# Patient Record
Sex: Male | Born: 1940
Health system: Southern US, Community
[De-identification: ages and names within clinical notes are randomized; demographics above are authoritative.]

## PROBLEM LIST (undated history)

## (undated) DIAGNOSIS — I2510 Atherosclerotic heart disease of native coronary artery without angina pectoris: Secondary | ICD-10-CM

## (undated) DIAGNOSIS — I10 Essential (primary) hypertension: Secondary | ICD-10-CM

## (undated) DIAGNOSIS — I4891 Unspecified atrial fibrillation: Secondary | ICD-10-CM

## (undated) HISTORY — DX: Essential (primary) hypertension: I10

## (undated) HISTORY — PX: INGUINAL HERNIA REPAIR: SUR1180

## (undated) HISTORY — PX: FOOT SURGERY: SHX648

## (undated) HISTORY — DX: Unspecified atrial fibrillation: I48.91

## (undated) HISTORY — PX: CARDIAC CATHETERIZATION: SHX172

## (undated) HISTORY — PX: BACK SURGERY: SHX140

## (undated) HISTORY — PX: CORONARY ANGIOPLASTY: SHX604

## (undated) HISTORY — PX: APPENDECTOMY: SHX54

## (undated) HISTORY — PX: TONSILLECTOMY AND ADENOIDECTOMY: SUR1326

## (undated) HISTORY — DX: Atherosclerotic heart disease of native coronary artery without angina pectoris: I25.10

---

## 1998-11-27 ENCOUNTER — Ambulatory Visit (HOSPITAL_COMMUNITY): Admission: RE | Admit: 1998-11-27 | Discharge: 1998-11-27 | Payer: Self-pay | Admitting: Cardiology

## 1998-12-03 ENCOUNTER — Ambulatory Visit (HOSPITAL_COMMUNITY): Admission: RE | Admit: 1998-12-03 | Discharge: 1998-12-03 | Payer: Self-pay | Admitting: Cardiovascular Disease

## 2003-12-05 ENCOUNTER — Ambulatory Visit: Payer: Self-pay | Admitting: Cardiology

## 2003-12-19 ENCOUNTER — Ambulatory Visit: Payer: Self-pay | Admitting: Cardiology

## 2004-01-09 ENCOUNTER — Ambulatory Visit: Payer: Self-pay | Admitting: Internal Medicine

## 2004-02-27 ENCOUNTER — Ambulatory Visit: Payer: Self-pay | Admitting: Cardiology

## 2004-03-26 ENCOUNTER — Ambulatory Visit: Payer: Self-pay | Admitting: Internal Medicine

## 2004-04-23 ENCOUNTER — Ambulatory Visit: Payer: Self-pay | Admitting: Cardiology

## 2004-05-21 ENCOUNTER — Ambulatory Visit: Payer: Self-pay | Admitting: Cardiology

## 2004-06-18 ENCOUNTER — Ambulatory Visit: Payer: Self-pay | Admitting: Cardiology

## 2004-07-17 ENCOUNTER — Ambulatory Visit: Payer: Self-pay | Admitting: Cardiology

## 2004-08-13 ENCOUNTER — Ambulatory Visit: Payer: Self-pay

## 2004-09-10 ENCOUNTER — Ambulatory Visit: Payer: Self-pay | Admitting: Cardiology

## 2004-09-24 ENCOUNTER — Ambulatory Visit: Payer: Self-pay | Admitting: Cardiology

## 2004-10-23 ENCOUNTER — Ambulatory Visit: Payer: Self-pay | Admitting: Cardiology

## 2004-11-19 ENCOUNTER — Ambulatory Visit: Payer: Self-pay | Admitting: Cardiology

## 2004-12-17 ENCOUNTER — Ambulatory Visit: Payer: Self-pay | Admitting: Cardiovascular Disease

## 2005-01-14 ENCOUNTER — Ambulatory Visit: Payer: Self-pay | Admitting: Cardiology

## 2005-12-30 ENCOUNTER — Encounter: Admission: RE | Admit: 2005-12-30 | Discharge: 2005-12-30 | Payer: Self-pay | Admitting: Orthopedic Surgery

## 2008-05-26 ENCOUNTER — Encounter (INDEPENDENT_AMBULATORY_CARE_PROVIDER_SITE_OTHER): Payer: Self-pay | Admitting: *Deleted

## 2008-06-13 ENCOUNTER — Encounter: Payer: Self-pay | Admitting: *Deleted

## 2008-07-03 ENCOUNTER — Encounter (INDEPENDENT_AMBULATORY_CARE_PROVIDER_SITE_OTHER): Payer: Self-pay | Admitting: *Deleted

## 2008-07-04 ENCOUNTER — Encounter: Payer: Self-pay | Admitting: Cardiology

## 2008-07-04 LAB — CONVERTED CEMR LAB
POC INR: 2.3
Prothrombin Time: 22.7 s

## 2008-07-19 ENCOUNTER — Encounter: Payer: Self-pay | Admitting: *Deleted

## 2008-08-18 ENCOUNTER — Encounter (INDEPENDENT_AMBULATORY_CARE_PROVIDER_SITE_OTHER): Payer: Self-pay | Admitting: Cardiology

## 2008-08-21 ENCOUNTER — Encounter: Payer: Self-pay | Admitting: Cardiology

## 2008-09-14 ENCOUNTER — Encounter (INDEPENDENT_AMBULATORY_CARE_PROVIDER_SITE_OTHER): Payer: Self-pay | Admitting: Cardiology

## 2008-09-15 ENCOUNTER — Encounter: Payer: Self-pay | Admitting: Internal Medicine

## 2008-09-15 LAB — CONVERTED CEMR LAB: POC INR: 2.4

## 2008-10-24 ENCOUNTER — Encounter (INDEPENDENT_AMBULATORY_CARE_PROVIDER_SITE_OTHER): Payer: Self-pay | Admitting: Cardiology

## 2008-10-26 ENCOUNTER — Encounter: Payer: Self-pay | Admitting: Cardiology

## 2008-10-26 LAB — CONVERTED CEMR LAB
POC INR: 3
Prothrombin Time: 32 s

## 2008-11-21 ENCOUNTER — Encounter (INDEPENDENT_AMBULATORY_CARE_PROVIDER_SITE_OTHER): Payer: Self-pay | Admitting: Pharmacist

## 2008-11-22 ENCOUNTER — Encounter: Payer: Self-pay | Admitting: Internal Medicine

## 2008-11-22 LAB — CONVERTED CEMR LAB
POC INR: 2
Prothrombin Time: 21.6 s

## 2008-12-18 ENCOUNTER — Encounter (INDEPENDENT_AMBULATORY_CARE_PROVIDER_SITE_OTHER): Payer: Self-pay | Admitting: Cardiology

## 2008-12-20 ENCOUNTER — Encounter: Payer: Self-pay | Admitting: Cardiology

## 2008-12-20 LAB — CONVERTED CEMR LAB
POC INR: 2.6
Prothrombin Time: 27.3 s

## 2009-01-17 ENCOUNTER — Encounter (INDEPENDENT_AMBULATORY_CARE_PROVIDER_SITE_OTHER): Payer: Self-pay | Admitting: Cardiology

## 2009-01-19 ENCOUNTER — Encounter: Payer: Self-pay | Admitting: Cardiology

## 2009-01-19 LAB — CONVERTED CEMR LAB
POC INR: 2.3
Prothrombin Time: 23.9 s

## 2009-02-16 ENCOUNTER — Encounter (INDEPENDENT_AMBULATORY_CARE_PROVIDER_SITE_OTHER): Payer: Self-pay | Admitting: Cardiology

## 2009-02-19 ENCOUNTER — Telehealth (INDEPENDENT_AMBULATORY_CARE_PROVIDER_SITE_OTHER): Payer: Self-pay | Admitting: Cardiology

## 2009-02-26 ENCOUNTER — Encounter: Payer: Self-pay | Admitting: Cardiology

## 2009-03-21 ENCOUNTER — Encounter (INDEPENDENT_AMBULATORY_CARE_PROVIDER_SITE_OTHER): Payer: Self-pay | Admitting: Cardiology

## 2009-03-22 ENCOUNTER — Encounter: Payer: Self-pay | Admitting: Cardiology

## 2009-03-22 LAB — CONVERTED CEMR LAB
POC INR: 2.2
Prothrombin Time: 22.9 s

## 2009-04-11 ENCOUNTER — Encounter (INDEPENDENT_AMBULATORY_CARE_PROVIDER_SITE_OTHER): Payer: Self-pay | Admitting: Pharmacist

## 2009-04-12 ENCOUNTER — Encounter: Payer: Self-pay | Admitting: Internal Medicine

## 2009-04-12 LAB — CONVERTED CEMR LAB
POC INR: 2.5
Prothrombin Time: 26.8 s

## 2009-05-08 ENCOUNTER — Encounter: Payer: Self-pay | Admitting: Cardiovascular Disease

## 2009-05-08 ENCOUNTER — Encounter (INDEPENDENT_AMBULATORY_CARE_PROVIDER_SITE_OTHER): Payer: Self-pay | Admitting: Pharmacist

## 2009-05-08 LAB — CONVERTED CEMR LAB
POC INR: 2.1
Prothrombin Time: 21.9 s

## 2009-05-09 ENCOUNTER — Encounter: Payer: Self-pay | Admitting: Cardiovascular Disease

## 2009-06-05 ENCOUNTER — Encounter: Payer: Self-pay | Admitting: Cardiovascular Disease

## 2009-06-07 ENCOUNTER — Encounter: Payer: Self-pay | Admitting: Cardiology

## 2009-06-07 LAB — CONVERTED CEMR LAB: POC INR: 1.9

## 2009-07-02 ENCOUNTER — Encounter: Payer: Self-pay | Admitting: Cardiology

## 2009-07-02 ENCOUNTER — Encounter: Payer: Self-pay | Admitting: Cardiovascular Disease

## 2009-07-03 ENCOUNTER — Encounter: Payer: Self-pay | Admitting: Cardiology

## 2009-08-03 ENCOUNTER — Encounter: Payer: Self-pay | Admitting: Cardiovascular Disease

## 2009-08-06 ENCOUNTER — Encounter: Payer: Self-pay | Admitting: Cardiology

## 2009-08-06 LAB — CONVERTED CEMR LAB
POC INR: 2.2
Prothrombin Time: 23.1 s

## 2009-09-04 ENCOUNTER — Encounter: Payer: Self-pay | Admitting: Cardiology

## 2010-02-14 NOTE — Medication Information (Signed)
Summary: [facility]  Anticoagulant Therapy  Managed by: Cloyde Reams, RN, BSN Referring MD: Olga Millers MD Supervising MD: Ladona Ridgel MD, Sharlot Gowda Indication 1: Atrial Fibrillation (ICD-427.31) Lab Used: [facility] Site: [address] PT 26.8 INR POC 2.5 INR RANGE 2 - 3    Bleeding/hemorrhagic complications: no     Any changes in medication regimen? no     Any missed doses?: no         Allergies: No Known Drug Allergies  Anticoagulation Management History:      The patient comes in today for the following reason: elephone management.  Positive risk factors for bleeding include an age of 70 years or older.  The bleeding index is 'intermediate risk'.  Negative CHADS2 values include Age > 51 years old.  The start date was 11/22/1998.  Prothrombin time is 26.8.  Anticoagulation responsible provider: Ladona Ridgel MD, Sharlot Gowda.  INR POC: 2.5.    Anticoagulation Management Assessment/Plan:      The target INR is 2.0-3.0.  The next INR is due 05/10/2009.  Anticoagulation instructions were given to patient.  Results were reviewed/authorized by Cloyde Reams, RN, BSN.  He was notified by Cloyde Reams RN.         Prior Anticoagulation Instructions: INR 2.2 Continue 5mg s daily. Recheck in 4 weeks.   Current Anticoagulation Instructions: INR 2.5 LMOM for pt. to call for dosing. Bethena Midget, RN, BSN  April 12, 2009 2:06 PM Called spoke with pt advised to continue on same dosage 5mg  daily.  Recheck in 4 weeks.

## 2010-02-14 NOTE — Medication Information (Signed)
Summary: [facility]  Anticoagulant Therapy  Managed by: Bethena Midget, RN, BSN Referring MD: Olga Millers MD Supervising MD: Daleen Squibb MD, Maisie Fus Indication 1: Atrial Fibrillation (ICD-427.31) Lab Used: [facility] [place] Site: [address] PT 22.9 INR POC 2.2 INR RANGE 2 - 3  Dietary changes: no    Health status changes: no    Bleeding/hemorrhagic complications: no    Recent/future hospitalizations: no    Any changes in medication regimen? no    Recent/future dental: no  Any missed doses?: no       Is patient compliant with meds? yes       Allergies: No Known Drug Allergies  Anticoagulation Management History:      His anticoagulation is being managed by telephone today.  Positive risk factors for bleeding include an age of 70 years or older.  The bleeding index is 'intermediate risk'.  Negative CHADS2 values include Age > 72 years old.  The start date was 11/22/1998.  Prothrombin time is 22.9.  Anticoagulation responsible provider: Daleen Squibb MD, Maisie Fus.  INR POC: 2.2.    Anticoagulation Management Assessment/Plan:      The target INR is 2.0-3.0.  The next INR is due 04/19/2009.  Anticoagulation instructions were given to patient.  Results were reviewed/authorized by Bethena Midget, RN, BSN.  He was notified by Bethena Midget, RN, BSN.         Prior Anticoagulation Instructions: INR 2.0  Continue 1 tab daily.  d/w pt on 2/14 at 0900.  He will recheck in 4 weeks.    Current Anticoagulation Instructions: INR 2.2 Continue 5mg s daily. Recheck in 4 weeks.

## 2010-02-14 NOTE — Progress Notes (Signed)
Summary: called to check on PT/INR  Phone Note Outgoing Call Call back at [facility] Phone [phone] Call back at Work Phone [phone]   Call placed by: Shelby Dubin PharmD, BCPS, CPP,  February 19, 2009 10:10 AM Call placed to: Patient Summary of Call: Called pt at home number --at work Called work and talked with patient--Bloodwork done on Th/Fr.   Initial call taken by: Shelby Dubin PharmD, BCPS, CPP,  February 19, 2009 10:11 AM

## 2010-02-14 NOTE — Medication Information (Signed)
Summary: [facility]  Anticoagulant Therapy  Managed by: Weston Brass, PharmD Referring MD: Olga Millers MD Supervising MD: Riley Kill MD, Maisie Fus Indication 1: Atrial Fibrillation (ICD-427.31) Lab Used: [facility] [place] Site: [facility] [address] PT 23.1 INR POC 2.2 INR RANGE 2 - 3  Dietary changes: no    Health status changes: no    Bleeding/hemorrhagic complications: no    Recent/future hospitalizations: no    Any changes in medication regimen? no    Recent/future dental: no  Any missed doses?: no       Is patient compliant with meds? yes       Allergies: No Known Drug Allergies  Anticoagulation Management History:      His anticoagulation is being managed by telephone today.  Positive risk factors for bleeding include an age of 70 years or older.  The bleeding index is 'intermediate risk'.  Negative CHADS2 values include Age > 32 years old.  The start date was 11/22/1998.  Prothrombin time is 23.1.  Anticoagulation responsible provider: Riley Kill MD, Maisie Fus.  INR POC: 2.2.    Anticoagulation Management Assessment/Plan:      The target INR is 2.0-3.0.  The next INR is due 09/03/2009.  Anticoagulation instructions were given to patient.  Results were reviewed/authorized by Weston Brass, PharmD.  He was notified by Weston Brass PharmD.         Prior Anticoagulation Instructions: INR 2.1  Spoke with pt.  Continue same dose of 1 tablet every day.  Recheck in 4 weeks.   Current Anticoagulation Instructions: INR 2.2  Attempted to call pt with results.  LMOM CB. Cloyde Reams RN  August 06, 2009 1:58 PM   Spoke with pt.  Continue same dose of 1 tablet every day.  Recheck INR in 4 weeks. Weston Brass PharmD  August 06, 2009 2:15 PM

## 2010-02-14 NOTE — Medication Information (Signed)
Summary: [facility]  Anticoagulant Therapy  Managed by: Inactive Referring MD: Olga Millers MD Supervising MD: Jens Som MD, Arlys John Indication 1: Atrial Fibrillation (ICD-427.31) Lab Used: [facility] [place] Site: [address] INR RANGE 2 - 3          Comments: Pt states he has been following with Dr Sherlyn Lick in [place] and hasn't seen Dr Jens Som in several yrs.   Allergies: No Known Drug Allergies  Anticoagulation Management History:      Positive risk factors for bleeding include an age of 70 years or older.  The bleeding index is 'intermediate risk'.  Negative CHADS2 values include Age > 44 years old.  The start date was 11/22/1998.  Anticoagulation responsible provider: Jens Som MD, Arlys John.    Anticoagulation Management Assessment/Plan:      The target INR is 2.0-3.0.  The next INR is due 09/03/2009.  Anticoagulation instructions were given to patient.  Results were reviewed/authorized by Inactive.         Prior Anticoagulation Instructions: INR 2.2  Attempted to call pt with results.  LMOM CB. Cloyde Reams RN  August 06, 2009 1:58 PM   Spoke with pt.  Continue same dose of 1 tablet every day.  Recheck INR in 4 weeks. Weston Brass PharmD  August 06, 2009 2:15 PM

## 2010-02-14 NOTE — Medication Information (Signed)
Summary: [facility]  Anticoagulant Therapy  Managed by: Weston Brass, PharmD Referring MD: Olga Millers MD Supervising MD: Shirlee Latch MD, Freida Busman Indication 1: Atrial Fibrillation (ICD-427.31) Lab Used: [facility] [place] Site: [address] PT 22.3 INR POC 2.1 INR RANGE 2 - 3  Dietary changes: no    Health status changes: no    Bleeding/hemorrhagic complications: no    Recent/future hospitalizations: no    Any changes in medication regimen? no    Recent/future dental: no  Any missed doses?: no       Is patient compliant with meds? yes       Allergies: No Known Drug Allergies  Anticoagulation Management History:      His anticoagulation is being managed by telephone today.  Positive risk factors for bleeding include an age of 70 years or older.  The bleeding index is 'intermediate risk'.  Negative CHADS2 values include Age > 27 years old.  The start date was 11/22/1998.  Prothrombin time is 22.3.  Anticoagulation responsible provider: Shirlee Latch MD, Hatsue Sime.  INR POC: 2.1.    Anticoagulation Management Assessment/Plan:      The target INR is 2.0-3.0.  The next INR is due 08/06/2009.  Anticoagulation instructions were given to patient.  Results were reviewed/authorized by Weston Brass, PharmD.  He was notified by Weston Brass PharmD.         Prior Anticoagulation Instructions: INR 1.9  Spoke with pt.  Take 1 1/2 tablets today then resume same dose of 1 tablet every day.  Recheck in 4 weeks.   Current Anticoagulation Instructions: INR 2.1  Spoke with pt.  Continue same dose of 1 tablet every day.  Recheck in 4 weeks.

## 2010-02-14 NOTE — Medication Information (Signed)
Summary: [facility]  Anticoagulant Therapy  Managed by: Bethena Midget, RN, BSN Referring MD: Olga Millers MD Supervising MD: Daleen Squibb MD, Maisie Fus Indication 1: Atrial Fibrillation (ICD-427.31) Lab Used: [facility] [place] Site: [address] PT 21.9 INR POC 2.1 INR RANGE 2 - 3  Dietary changes: no    Health status changes: no    Bleeding/hemorrhagic complications: no    Recent/future hospitalizations: no    Any changes in medication regimen? no    Recent/future dental: no  Any missed doses?: no       Is patient compliant with meds? yes      Comments: Pt. states he was out in the sun alot lately and yesterday thinks he got sun poisoning because he got fever, felt bad and got a rash on his skin. Today he feels alot better.   Allergies: No Known Drug Allergies  Anticoagulation Management History:      His anticoagulation is being managed by telephone today.  Positive risk factors for bleeding include an age of 70 years or older.  The bleeding index is 'intermediate risk'.  Negative CHADS2 values include Age > 70 years old.  The start date was 11/22/1998.  Prothrombin time is 21.9.  Anticoagulation responsible provider: Daleen Squibb MD, Maisie Fus.  INR POC: 2.1.    Anticoagulation Management Assessment/Plan:      The target INR is 2.0-3.0.  The next INR is due 06/05/2009.  Anticoagulation instructions were given to patient.  Results were reviewed/authorized by Bethena Midget, RN, BSN.  He was notified by Bethena Midget, RN, BSN.         Prior Anticoagulation Instructions: INR 2.5 LMOM for pt. to call for dosing. Bethena Midget, RN, BSN  April 12, 2009 2:06 PM Called spoke with pt advised to continue on same dosage 5mg  daily.  Recheck in 4 weeks.    Current Anticoagulation Instructions: INR 2.1 LMOM to continue 5mg s daily. Please return our call. Bethena Midget, RN, BSN  May 09, 2009 1:49 PM Spoke with pt he's aware to continue 5mg  daily. REcheck in 4 weeks.

## 2010-02-14 NOTE — Medication Information (Signed)
Summary: [facility]  Anticoagulant Therapy  Managed by: Bethena Midget, RN, BSN Referring MD: Olga Millers MD Supervising MD: Antoine Poche MD, Fayrene Fearing Indication 1: Atrial Fibrillation (ICD-427.31) Lab Used: [facility] [place] Site: [address] PT 23.9 INR POC 2.3 INR RANGE 2 - 3  Dietary changes: no    Health status changes: no    Bleeding/hemorrhagic complications: no    Recent/future hospitalizations: no    Any changes in medication regimen? no    Recent/future dental: no  Any missed doses?: no       Is patient compliant with meds? yes       Allergies: No Known Drug Allergies  Anticoagulation Management History:      His anticoagulation is being managed by telephone today.  Positive risk factors for bleeding include an age of 70 years or older.  The bleeding index is 'intermediate risk'.  Negative CHADS2 values include Age > 62 years old.  The start date was 11/22/1998.  Prothrombin time is 23.9.  Anticoagulation responsible provider: Antoine Poche MD, Fayrene Fearing.  INR POC: 2.3.    Anticoagulation Management Assessment/Plan:      The target INR is 2.0-3.0.  The next INR is due 02/16/2009.  Anticoagulation instructions were given to patient.  Results were reviewed/authorized by Bethena Midget, RN, BSN.  He was notified by Bethena Midget, RN, BSN.         Prior Anticoagulation Instructions: INR 2.6  Called spoke with pt.  Advised to continue on same dosage 5 mg daily.  Recheck in 4 weeks.    Current Anticoagulation Instructions: INR 2.3 Continue 5mg s daily. Recheck in 4 weeks. Spoke with pt. he's aware to recheck in 4 weeks.

## 2010-02-14 NOTE — Medication Information (Signed)
Summary: [facility]  Anticoagulant Therapy  Managed by: Weston Brass, PharmD Referring MD: Olga Millers MD Supervising MD: Daleen Squibb MD, Maisie Fus Indication 1: Atrial Fibrillation (ICD-427.31) Lab Used: [facility] [place] Site: [facility] PT 19.6 INR POC 1.9 INR RANGE 2 - 3  Dietary changes: no    Health status changes: no    Bleeding/hemorrhagic complications: no    Recent/future hospitalizations: no    Any changes in medication regimen? no    Recent/future dental: no  Any missed doses?: yes     Details: out of town and missed 1 dose  Is patient compliant with meds? yes      Comments: Pt's cell- [phone]  Allergies: No Known Drug Allergies  Anticoagulation Management History:      His anticoagulation is being managed by telephone today.  Positive risk factors for bleeding include an age of 70 years or older.  The bleeding index is 'intermediate risk'.  Negative CHADS2 values include Age > 70 years old.  The start date was 11/22/1998.  Prothrombin time is 19.6.  Anticoagulation responsible provider: Daleen Squibb MD, Maisie Fus.  INR POC: 1.9.    Anticoagulation Management Assessment/Plan:      The target INR is 2.0-3.0.  The next INR is due 07/05/2009.  Anticoagulation instructions were given to patient.  Results were reviewed/authorized by Weston Brass, PharmD.  He was notified by Weston Brass PharmD.         Prior Anticoagulation Instructions: INR 2.1 LMOM to continue 5mg s daily. Please return our call. Bethena Midget, RN, BSN  May 09, 2009 1:49 PM Spoke with pt he's aware to continue 5mg  daily. REcheck in 4 weeks.   Current Anticoagulation Instructions: INR 1.9  Spoke with pt.  Take 1 1/2 tablets today then resume same dose of 1 tablet every day.  Recheck in 4 weeks.

## 2011-02-19 ENCOUNTER — Ambulatory Visit
Admission: RE | Admit: 2011-02-19 | Discharge: 2011-02-19 | Disposition: A | Discharge status: Home / Self Care | Payer: PRIVATE HEALTH INSURANCE | Source: Ambulatory Visit | Attending: Sports Medicine | Admitting: Sports Medicine

## 2011-02-19 ENCOUNTER — Other Ambulatory Visit: Payer: Self-pay | Admitting: Sports Medicine

## 2011-02-19 DIAGNOSIS — M542 Cervicalgia: Secondary | ICD-10-CM

## 2012-02-18 ENCOUNTER — Ambulatory Visit (INDEPENDENT_AMBULATORY_CARE_PROVIDER_SITE_OTHER): Payer: PRIVATE HEALTH INSURANCE

## 2012-02-18 ENCOUNTER — Other Ambulatory Visit: Payer: Self-pay | Admitting: Sports Medicine

## 2012-02-18 DIAGNOSIS — M25562 Pain in left knee: Secondary | ICD-10-CM

## 2012-02-18 DIAGNOSIS — M25569 Pain in unspecified knee: Secondary | ICD-10-CM

## 2012-06-15 IMAGING — CR DG CERVICAL SPINE 2 OR 3 VIEWS
2 series · 2 of 2 positions shown · non-contrast
Comparison: None

CLINICAL DATA: Neck pain.

CERVICAL SPINE - 2-3 VIEW

[view not recorded (1 of 2)]
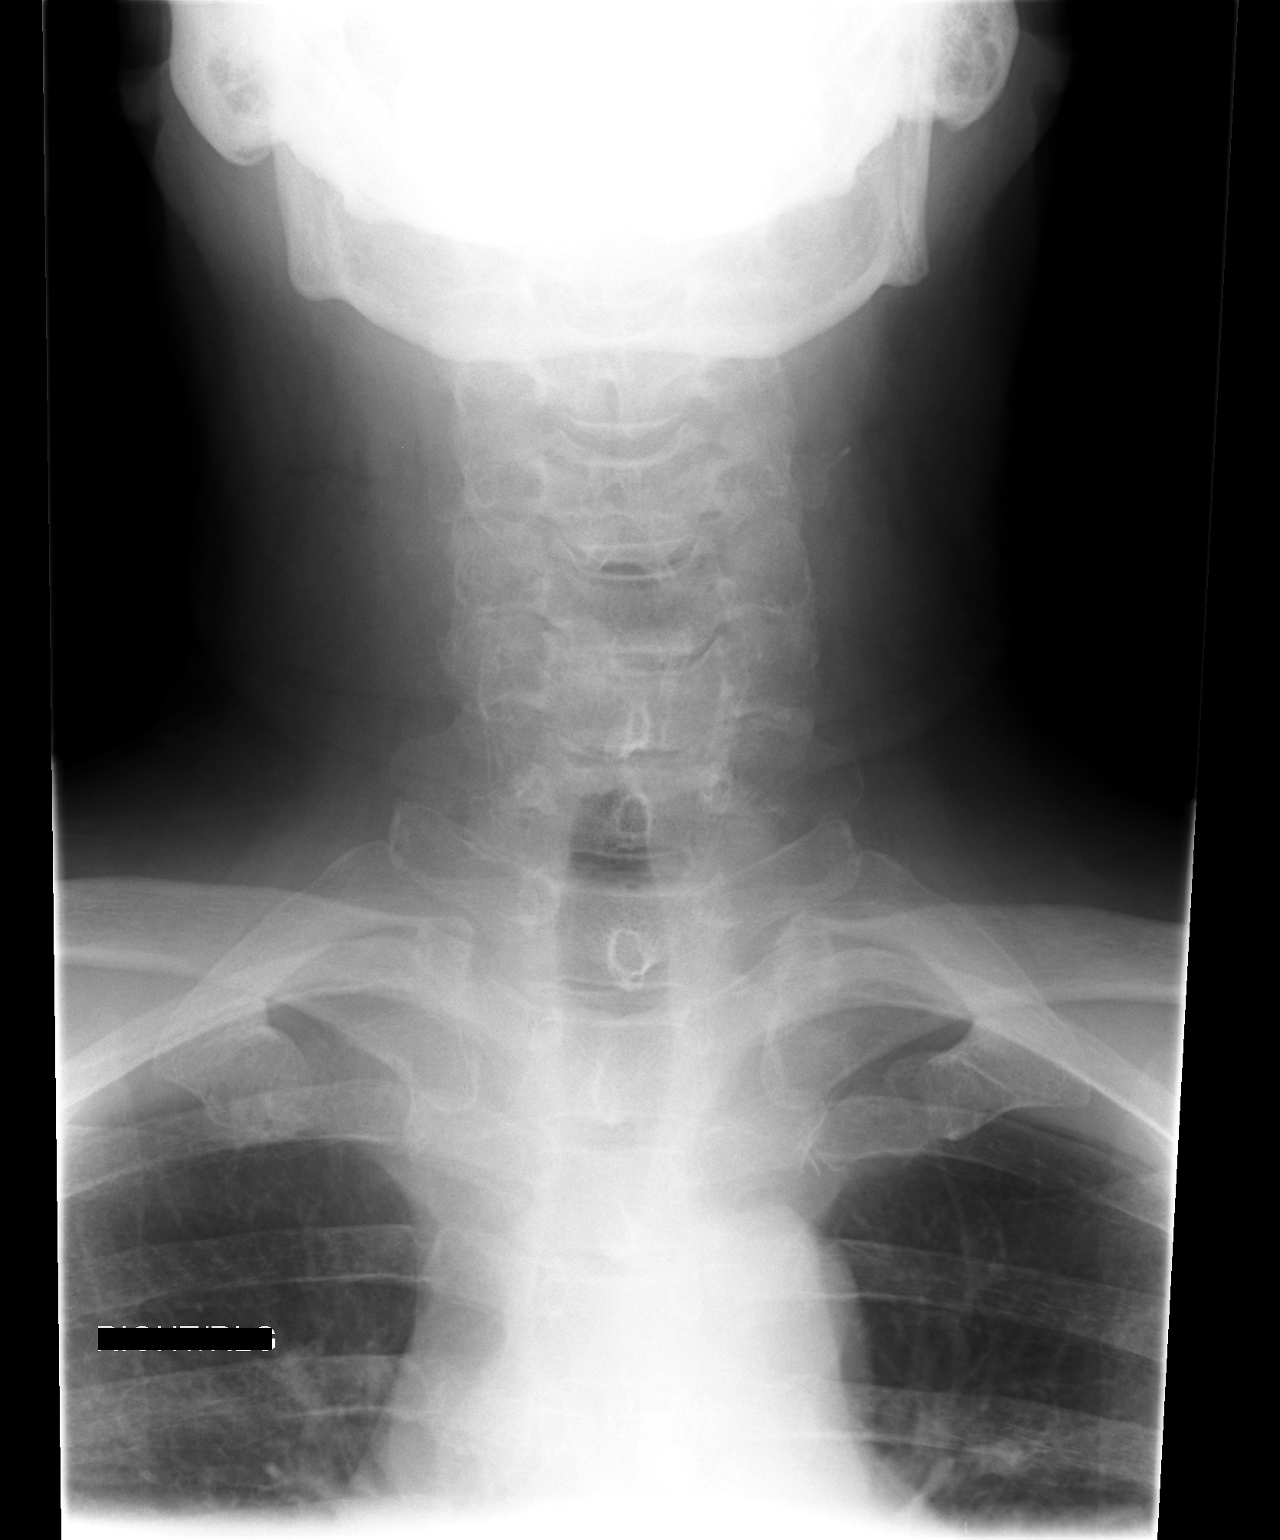

[view not recorded (2 of 2)]
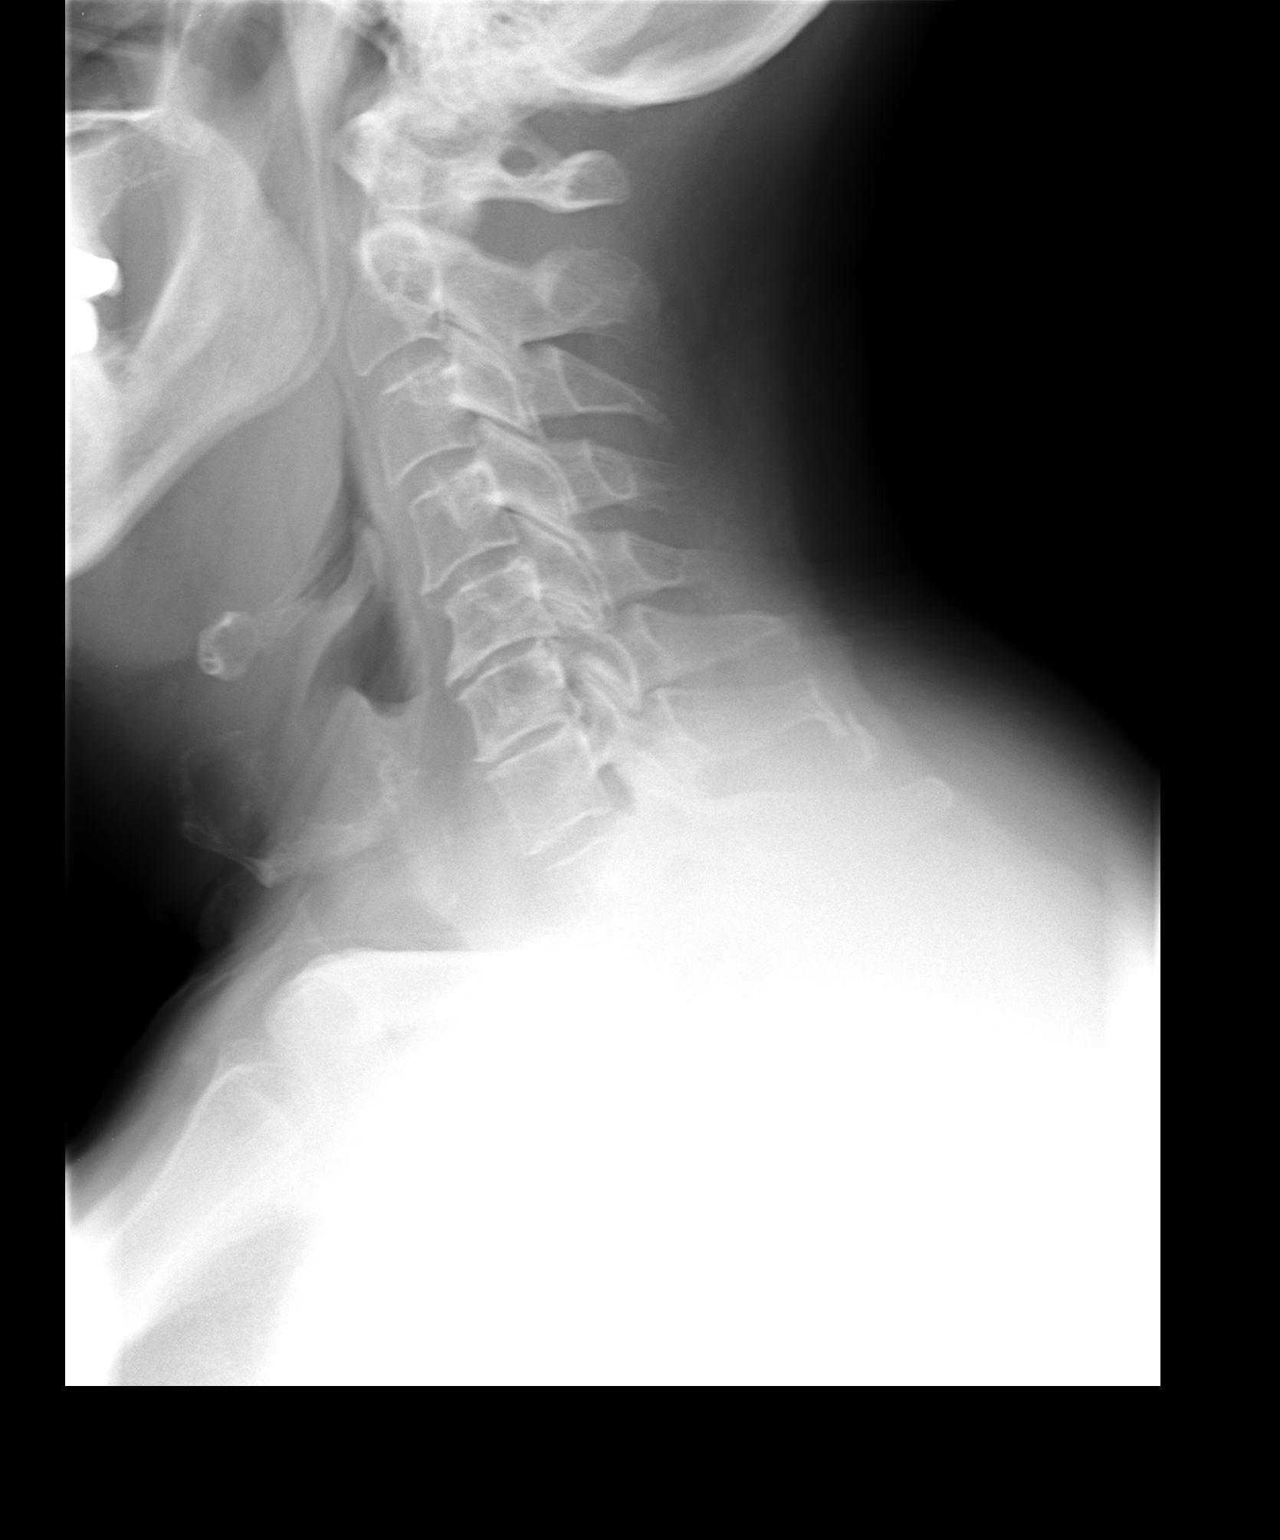

[2 of 2 positions shown; findings below may reference images not displayed]

FINDINGS: The lateral film demonstrates mild degenerative posterior
subluxation of C5.  Moderate degenerative disc disease at C5-6 and
C6-7.  No acute bony findings or abnormal prevertebral soft tissue
swelling.  Calcification noted in the nuchal ligament at C7-T1.
The lung apices are clear.
IMPRESSION: Degenerative cervical spondylosis with disc disease most notable at
C5-6 and C6-7.

## 2014-10-03 DIAGNOSIS — E782 Mixed hyperlipidemia: Secondary | ICD-10-CM | POA: Insufficient documentation

## 2014-10-03 DIAGNOSIS — I482 Chronic atrial fibrillation, unspecified: Secondary | ICD-10-CM | POA: Insufficient documentation

## 2014-10-03 DIAGNOSIS — I10 Essential (primary) hypertension: Secondary | ICD-10-CM | POA: Insufficient documentation

## 2015-02-22 DIAGNOSIS — I25118 Atherosclerotic heart disease of native coronary artery with other forms of angina pectoris: Secondary | ICD-10-CM | POA: Insufficient documentation

## 2015-07-30 DIAGNOSIS — R001 Bradycardia, unspecified: Secondary | ICD-10-CM | POA: Insufficient documentation

## 2016-09-18 ENCOUNTER — Ambulatory Visit: Payer: PRIVATE HEALTH INSURANCE | Admitting: Cardiology

## 2016-09-18 ENCOUNTER — Ambulatory Visit (INDEPENDENT_AMBULATORY_CARE_PROVIDER_SITE_OTHER): Payer: PRIVATE HEALTH INSURANCE | Admitting: Cardiology

## 2016-09-18 ENCOUNTER — Encounter: Payer: Self-pay | Admitting: Cardiology

## 2016-09-18 VITALS — BP 138/80 | HR 64 | Resp 10 | Ht 73.0 in | Wt 211.4 lb

## 2016-09-18 DIAGNOSIS — I482 Chronic atrial fibrillation, unspecified: Secondary | ICD-10-CM

## 2016-09-18 DIAGNOSIS — E782 Mixed hyperlipidemia: Secondary | ICD-10-CM

## 2016-09-18 DIAGNOSIS — R001 Bradycardia, unspecified: Secondary | ICD-10-CM | POA: Diagnosis not present

## 2016-09-18 DIAGNOSIS — I25118 Atherosclerotic heart disease of native coronary artery with other forms of angina pectoris: Secondary | ICD-10-CM

## 2016-09-18 DIAGNOSIS — I10 Essential (primary) hypertension: Secondary | ICD-10-CM

## 2016-09-18 MED ORDER — ATORVASTATIN CALCIUM 10 MG PO TABS
10.0000 mg | ORAL_TABLET | Freq: Every day | ORAL | 11 refills | Status: AC
Start: 2016-09-18 — End: 2022-03-31

## 2016-09-18 NOTE — Patient Instructions (Signed)
Medication Instructions:  1.) START taking Lipitor (atorovastatin) 10 mg daily.   Labwork: None   Testing/Procedures: EKG  Follow-Up: Your physician wants you to follow-up in: 6 months. You will receive a reminder letter in the mail two months in advance. If you don't receive a letter, please call our office to schedule the follow-up appointment.  Any Other Special Instructions Will Be Listed Below (If Applicable).  Please note that any paperwork needing to be filled out by the provider will need to be addressed at the front desk prior to seeing the provider. Please note that any paperwork FMLA, Disability or other documents regarding health condition is subject to a $25.00 charge that must be received prior to completion of paperwork in the form of a money order or check.    If you need a refill on your cardiac medications before your next appointment, please call your pharmacy.

## 2016-09-18 NOTE — Progress Notes (Signed)
Cardiology Office Note:    Date:  09/18/2016   ID:  Nicholas Pratt, DOB 1940-05-08, MRN [id number]  PCP:  Mateo Flow, MD  Cardiologist:  Jenne Campus, MD    Referring MD: Mateo Flow, MD   Chief Complaint  Patient presents with  . Follow-up  Multiple cardiac issues  History of Present Illness:    Nicholas Pratt is a 76 y.o. male  with coronary artery disease, essential hypertension, chronic atrial fibrillation. Doing well from cardiac stent to view. Denies having the chest pain tightness squeezing pressure burning chest. Still play golf on the radial basis and a matter of fact he played yesterday he did well. There is no dizziness no passing out. No chest pain no palpitations.  Past Medical History:  Diagnosis Date  . A-fib ([place])   . Coronary artery disease   . Hypertension     Past Surgical History:  Procedure Laterality Date  . APPENDECTOMY    . BACK SURGERY    . CARDIAC CATHETERIZATION    . CORONARY ANGIOPLASTY    . FOOT SURGERY    . INGUINAL HERNIA REPAIR    . TONSILLECTOMY AND ADENOIDECTOMY      Current Medications: Current Meds  Medication Sig  . magnesium chloride (SLOW-MAG) 64 MG TBEC SR tablet Take 2 tablets by mouth daily.  . metoprolol tartrate (LOPRESSOR) 25 MG tablet Take 12.5 mg by mouth 2 (two) times daily.  . nitroGLYCERIN (NITROSTAT) 0.4 MG SL tablet Place 0.4 mg under the tongue as needed for chest pain.  Marland Kitchen warfarin (COUMADIN) 4 MG tablet Take 4 mg by mouth as directed.     Allergies:   Oxycodone   Social History   Social History  . Marital status: Married    Spouse name: N/A  . Number of children: N/A  . Years of education: N/A   Social History Main Topics  . Smoking status: Former Research scientist (life sciences)  . Smokeless tobacco: Never Used  . Alcohol use No  . Drug use: No  . Sexual activity: Not Asked   Other Topics Concern  . None   Social History Narrative  . None     Family History: The patient's family history includes Heart disease  in his father and mother. ROS:   Please see the history of present illness.    All 14 point review of systems negative except as described per history of present illness  EKGs/Labs/Other Studies Reviewed:      Recent Labs: No results found for requested labs within last 8760 hours.  Recent Lipid Panel No results found for: CHOL, TRIG, HDL, CHOLHDL, VLDL, LDLCALC, LDLDIRECT  Physical Exam:    VS:  BP 138/80   Pulse 64   Resp 10   Ht 6\' 1"  (1.854 m)   Wt 211 lb 6.4 oz (95.9 kg)   BMI 27.89 kg/m     Wt Readings from Last 3 Encounters:  09/18/16 211 lb 6.4 oz (95.9 kg)     GEN:  Well nourished, well developed in no acute distress HEENT: Normal NECK: No JVD; No carotid bruits LYMPHATICS: No lymphadenopathy CARDIAC:Irregularly irregular, no murmurs, no rubs, no gallops RESPIRATORY:  Clear to auscultation without rales, wheezing or rhonchi  ABDOMEN: Soft, non-tender, non-distended MUSCULOSKELETAL:  No edema; No deformity  SKIN: Warm and dry LOWER EXTREMITIES: no swelling NEUROLOGIC:  Alert and oriented x 3 PSYCHIATRIC:  Normal affect   ASSESSMENT:    1. Chronic atrial fibrillation ([place])   2. Coronary artery  disease of native artery of native heart with stable angina pectoris ([place])   3. Essential hypertension   4. Bradycardia   5. Mixed dyslipidemia    PLAN:    In order of problems listed above:  1. Chronic atrial fibrillation: Anticoagulated with warfarin. His chads score equals 63, will continue. 2. Coronary artery disease: Stable on appropriate medications which I will continue. 3. Essential hypertension we'll continue present management. 4. Bradycardia: Asymptomatic 5. Dyslipidemia he is on Lipitor was stopped on surgery last year. We'll restart Lipitor 10.   Medication Adjustments/Labs and Tests Ordered: Current medicines are reviewed at length with the patient today.  Concerns regarding medicines are outlined above.  No orders of the defined types were  placed in this encounter.  Medication changes: No orders of the defined types were placed in this encounter.   Signed, Park Liter, MD, [facility] 09/18/2016 10:04 AM    [place]

## 2016-10-06 ENCOUNTER — Ambulatory Visit: Payer: PRIVATE HEALTH INSURANCE | Admitting: Cardiology

## 2016-10-22 NOTE — Addendum Note (Signed)
Addended by: Kathyrn Sheriff on: 10/22/2016 02:56 PM   Modules accepted: Orders

## 2016-10-23 LAB — PROTIME-INR
INR: 2.3 — ABNORMAL HIGH (ref 0.8–1.2)
PROTHROMBIN TIME: 22.9 s — AB (ref 9.1–12.0)

## 2016-10-23 LAB — LIPID PANEL
CHOLESTEROL TOTAL: 147 mg/dL (ref 100–199)
Chol/HDL Ratio: 4 ratio (ref 0.0–5.0)
HDL: 37 mg/dL — AB (ref >39)
LDL Calculated: 74 mg/dL (ref 0–99)
TRIGLYCERIDES: 182 mg/dL — AB (ref 0–149)
VLDL Cholesterol Cal: 36 mg/dL (ref 5–40)

## 2016-10-23 LAB — HEPATIC FUNCTION PANEL
ALT: 12 IU/L (ref 0–44)
AST: 23 IU/L (ref 0–40)
Albumin: 4.3 g/dL (ref 3.5–4.8)
Alkaline Phosphatase: 52 IU/L (ref 39–117)
Bilirubin Total: 0.6 mg/dL (ref 0.0–1.2)
Bilirubin, Direct: 0.19 mg/dL (ref 0.00–0.40)
TOTAL PROTEIN: 6.5 g/dL (ref 6.0–8.5)

## 2016-10-28 ENCOUNTER — Ambulatory Visit (INDEPENDENT_AMBULATORY_CARE_PROVIDER_SITE_OTHER): Payer: PRIVATE HEALTH INSURANCE | Admitting: Pharmacist

## 2016-10-28 DIAGNOSIS — I482 Chronic atrial fibrillation, unspecified: Secondary | ICD-10-CM

## 2016-10-28 DIAGNOSIS — Z5181 Encounter for therapeutic drug level monitoring: Secondary | ICD-10-CM | POA: Insufficient documentation

## 2016-11-17 ENCOUNTER — Ambulatory Visit (INDEPENDENT_AMBULATORY_CARE_PROVIDER_SITE_OTHER): Payer: PRIVATE HEALTH INSURANCE | Admitting: *Deleted

## 2016-11-17 DIAGNOSIS — Z5181 Encounter for therapeutic drug level monitoring: Secondary | ICD-10-CM

## 2016-11-17 DIAGNOSIS — I482 Chronic atrial fibrillation, unspecified: Secondary | ICD-10-CM

## 2016-11-17 LAB — PROTIME-INR
INR: 1.8 — AB (ref 0.8–1.2)
Prothrombin Time: 19.4 s — ABNORMAL HIGH (ref 9.1–12.0)

## 2016-11-17 NOTE — Progress Notes (Signed)
Pt/inr

## 2016-12-01 ENCOUNTER — Ambulatory Visit (INDEPENDENT_AMBULATORY_CARE_PROVIDER_SITE_OTHER): Payer: PRIVATE HEALTH INSURANCE | Admitting: *Deleted

## 2016-12-01 DIAGNOSIS — Z5181 Encounter for therapeutic drug level monitoring: Secondary | ICD-10-CM | POA: Diagnosis not present

## 2016-12-01 DIAGNOSIS — I482 Chronic atrial fibrillation, unspecified: Secondary | ICD-10-CM

## 2016-12-01 LAB — POCT INR: INR: 1.9

## 2016-12-01 NOTE — Patient Instructions (Signed)
Start taking warfarin 4mg  daily except 6mg  on Mondays. Recheck INR in 2 weeks in [facility] office.

## 2016-12-15 ENCOUNTER — Ambulatory Visit (INDEPENDENT_AMBULATORY_CARE_PROVIDER_SITE_OTHER): Payer: PRIVATE HEALTH INSURANCE | Admitting: *Deleted

## 2016-12-15 DIAGNOSIS — I482 Chronic atrial fibrillation, unspecified: Secondary | ICD-10-CM

## 2016-12-15 DIAGNOSIS — Z5181 Encounter for therapeutic drug level monitoring: Secondary | ICD-10-CM | POA: Diagnosis not present

## 2016-12-15 LAB — POCT INR: INR: 2

## 2016-12-15 NOTE — Patient Instructions (Addendum)
Continue taking warfarin 4mg  daily except 6mg  on Mondays. Recheck INR in 3 weeks in [employer] [facility] office

## 2016-12-29 ENCOUNTER — Ambulatory Visit (INDEPENDENT_AMBULATORY_CARE_PROVIDER_SITE_OTHER): Payer: PRIVATE HEALTH INSURANCE

## 2016-12-29 DIAGNOSIS — I482 Chronic atrial fibrillation, unspecified: Secondary | ICD-10-CM

## 2016-12-29 DIAGNOSIS — Z5181 Encounter for therapeutic drug level monitoring: Secondary | ICD-10-CM

## 2016-12-29 LAB — POCT INR: INR: 2

## 2016-12-29 NOTE — Patient Instructions (Signed)
Continue taking warfarin 4mg  daily except 6mg  on Mondays. Recheck INR in 3 weeks in [employer] office.

## 2017-01-19 ENCOUNTER — Ambulatory Visit (INDEPENDENT_AMBULATORY_CARE_PROVIDER_SITE_OTHER): Payer: PRIVATE HEALTH INSURANCE | Admitting: *Deleted

## 2017-01-19 DIAGNOSIS — I482 Chronic atrial fibrillation, unspecified: Secondary | ICD-10-CM

## 2017-01-19 DIAGNOSIS — Z5181 Encounter for therapeutic drug level monitoring: Secondary | ICD-10-CM | POA: Diagnosis not present

## 2017-01-19 LAB — POCT INR: INR: 2.2

## 2017-01-19 NOTE — Patient Instructions (Addendum)
Description   Continue taking warfarin 4mg  daily except 6mg  on Mondays. Recheck INR in 4 weeks in [facility] office.

## 2017-02-16 ENCOUNTER — Ambulatory Visit (INDEPENDENT_AMBULATORY_CARE_PROVIDER_SITE_OTHER): Payer: Self-pay | Admitting: *Deleted

## 2017-02-16 DIAGNOSIS — Z5181 Encounter for therapeutic drug level monitoring: Secondary | ICD-10-CM

## 2017-02-16 DIAGNOSIS — I482 Chronic atrial fibrillation, unspecified: Secondary | ICD-10-CM

## 2017-02-16 LAB — POCT INR: INR: 2.7

## 2017-02-16 NOTE — Patient Instructions (Signed)
Description   Continue taking warfarin 4mg  daily except 6mg  on Mondays. Recheck INR in 6 weeks in [employer] [facility] office.

## 2017-03-23 ENCOUNTER — Ambulatory Visit: Payer: PRIVATE HEALTH INSURANCE | Admitting: Cardiology

## 2017-03-30 ENCOUNTER — Ambulatory Visit (INDEPENDENT_AMBULATORY_CARE_PROVIDER_SITE_OTHER): Payer: PRIVATE HEALTH INSURANCE

## 2017-03-30 DIAGNOSIS — I482 Chronic atrial fibrillation, unspecified: Secondary | ICD-10-CM

## 2017-03-30 DIAGNOSIS — Z5181 Encounter for therapeutic drug level monitoring: Secondary | ICD-10-CM | POA: Diagnosis not present

## 2017-03-30 LAB — POCT INR: INR: 2

## 2017-03-30 NOTE — Patient Instructions (Signed)
Description   Continue taking warfarin 4 mg daily except 6 mg on Mondays. Recheck INR in 6 weeks in [employer] [facility] office.

## 2017-04-06 ENCOUNTER — Ambulatory Visit: Payer: PRIVATE HEALTH INSURANCE | Admitting: Cardiology

## 2017-05-11 ENCOUNTER — Ambulatory Visit (INDEPENDENT_AMBULATORY_CARE_PROVIDER_SITE_OTHER): Payer: PRIVATE HEALTH INSURANCE

## 2017-05-11 DIAGNOSIS — I482 Chronic atrial fibrillation, unspecified: Secondary | ICD-10-CM

## 2017-05-11 DIAGNOSIS — Z5181 Encounter for therapeutic drug level monitoring: Secondary | ICD-10-CM

## 2017-05-11 LAB — POCT INR: INR: 2.6

## 2017-05-11 NOTE — Patient Instructions (Signed)
Description   Continue taking warfarin 4 mg daily except 6 mg on Mondays. Recheck INR in 6 weeks in [facility] office. Please call the office with any concerns or medication changes [phone].

## 2017-06-16 ENCOUNTER — Telehealth: Payer: Self-pay | Admitting: Cardiology

## 2017-06-16 NOTE — Telephone Encounter (Signed)
Coumadin appointment has been changed.

## 2017-06-16 NOTE — Telephone Encounter (Signed)
Please call regarding coumadin change appt

## 2017-06-23 ENCOUNTER — Ambulatory Visit (INDEPENDENT_AMBULATORY_CARE_PROVIDER_SITE_OTHER): Payer: PRIVATE HEALTH INSURANCE

## 2017-06-23 DIAGNOSIS — Z5181 Encounter for therapeutic drug level monitoring: Secondary | ICD-10-CM | POA: Diagnosis not present

## 2017-06-23 DIAGNOSIS — I482 Chronic atrial fibrillation, unspecified: Secondary | ICD-10-CM

## 2017-06-23 LAB — POCT INR: INR: 1.7 — AB (ref 2.0–3.0)

## 2017-06-23 NOTE — Patient Instructions (Signed)
Description   Take 6 mg today and then continue taking warfarin 4 mg daily except 6 mg on Mondays. Recheck INR in in 10 days in [facility] office. Please call the office with any concerns or medication changes [phone].

## 2017-07-08 ENCOUNTER — Ambulatory Visit (INDEPENDENT_AMBULATORY_CARE_PROVIDER_SITE_OTHER): Payer: PRIVATE HEALTH INSURANCE | Admitting: Pharmacist

## 2017-07-08 DIAGNOSIS — I482 Chronic atrial fibrillation, unspecified: Secondary | ICD-10-CM

## 2017-07-08 DIAGNOSIS — Z5181 Encounter for therapeutic drug level monitoring: Secondary | ICD-10-CM

## 2017-07-08 LAB — POCT INR: INR: 3.9 — AB (ref 2.0–3.0)

## 2017-07-08 NOTE — Patient Instructions (Signed)
Description   No warfarin tomorrow then continue taking warfarin 4 mg daily except 6 mg on Mondays. Recheck INR in in 2 weeks in [facility] office. Please call the office with any concerns or medication changes [phone].

## 2017-07-20 ENCOUNTER — Ambulatory Visit (INDEPENDENT_AMBULATORY_CARE_PROVIDER_SITE_OTHER): Payer: Medicare Other

## 2017-07-20 DIAGNOSIS — I482 Chronic atrial fibrillation, unspecified: Secondary | ICD-10-CM

## 2017-07-20 DIAGNOSIS — Z5181 Encounter for therapeutic drug level monitoring: Secondary | ICD-10-CM | POA: Diagnosis not present

## 2017-07-20 LAB — POCT INR: INR: 1.5 — AB (ref 2.0–3.0)

## 2017-07-20 NOTE — Patient Instructions (Signed)
Description   Take 8 mg today and 6 mg tomorrow then continue taking warfarin 4 mg daily except 6 mg on Mondays. Recheck INR in in 10 days in the [place] office. Please call the office with any concerns or medication changes [phone].

## 2017-07-30 ENCOUNTER — Ambulatory Visit (INDEPENDENT_AMBULATORY_CARE_PROVIDER_SITE_OTHER): Payer: Medicare Other

## 2017-07-30 DIAGNOSIS — I482 Chronic atrial fibrillation, unspecified: Secondary | ICD-10-CM

## 2017-07-30 DIAGNOSIS — Z5181 Encounter for therapeutic drug level monitoring: Secondary | ICD-10-CM

## 2017-07-30 LAB — POCT INR: INR: 2.6 (ref 2.0–3.0)

## 2017-07-30 NOTE — Patient Instructions (Signed)
Description   Continue taking warfarin 4 mg daily except 6 mg on Mondays. Recheck INR in in 2 weeks in the [place] office. Please call the office with any concerns or medication changes [phone].

## 2017-08-13 ENCOUNTER — Ambulatory Visit (INDEPENDENT_AMBULATORY_CARE_PROVIDER_SITE_OTHER): Payer: Medicare Other

## 2017-08-13 DIAGNOSIS — I482 Chronic atrial fibrillation, unspecified: Secondary | ICD-10-CM

## 2017-08-13 DIAGNOSIS — Z5181 Encounter for therapeutic drug level monitoring: Secondary | ICD-10-CM | POA: Diagnosis not present

## 2017-08-13 LAB — POCT INR: INR: 3.4 — AB (ref 2.0–3.0)

## 2017-08-13 NOTE — Patient Instructions (Signed)
Description   Hold tomorrow's dose then continue taking warfarin 4 mg daily except 6 mg on Mondays. Recheck INR in in 1 week in the [place] office. Please call the office with any concerns or medication changes [phone].

## 2017-08-20 ENCOUNTER — Ambulatory Visit (INDEPENDENT_AMBULATORY_CARE_PROVIDER_SITE_OTHER): Payer: Medicare Other

## 2017-08-20 DIAGNOSIS — I482 Chronic atrial fibrillation, unspecified: Secondary | ICD-10-CM

## 2017-08-20 DIAGNOSIS — Z5181 Encounter for therapeutic drug level monitoring: Secondary | ICD-10-CM

## 2017-08-20 LAB — POCT INR: INR: 3.6 — AB (ref 2.0–3.0)

## 2017-08-20 NOTE — Patient Instructions (Signed)
Description   Hold tomorrow's dose then take 4 mg daily. Recheck INR in in 1 week in the [place] office. Please call the office with any concerns or medication changes [phone].

## 2017-08-26 DIAGNOSIS — Z6828 Body mass index (BMI) 28.0-28.9, adult: Secondary | ICD-10-CM | POA: Diagnosis not present

## 2017-08-26 DIAGNOSIS — E782 Mixed hyperlipidemia: Secondary | ICD-10-CM | POA: Diagnosis not present

## 2017-08-26 DIAGNOSIS — Z79899 Other long term (current) drug therapy: Secondary | ICD-10-CM | POA: Diagnosis not present

## 2017-08-26 DIAGNOSIS — Z1339 Encounter for screening examination for other mental health and behavioral disorders: Secondary | ICD-10-CM | POA: Diagnosis not present

## 2017-08-26 DIAGNOSIS — R413 Other amnesia: Secondary | ICD-10-CM | POA: Diagnosis not present

## 2017-08-27 ENCOUNTER — Ambulatory Visit (INDEPENDENT_AMBULATORY_CARE_PROVIDER_SITE_OTHER): Payer: Medicare Other

## 2017-08-27 DIAGNOSIS — I482 Chronic atrial fibrillation, unspecified: Secondary | ICD-10-CM

## 2017-08-27 DIAGNOSIS — Z5181 Encounter for therapeutic drug level monitoring: Secondary | ICD-10-CM

## 2017-08-27 LAB — POCT INR: INR: 1.6 — AB (ref 2.0–3.0)

## 2017-08-27 NOTE — Patient Instructions (Signed)
Description   Take 8 mg tomorrow then continue 4 mg daily. Recheck INR in in 1 week in the [place] office. Please call the office with any concerns or medication changes [phone].

## 2017-09-02 ENCOUNTER — Ambulatory Visit (INDEPENDENT_AMBULATORY_CARE_PROVIDER_SITE_OTHER): Payer: Medicare Other

## 2017-09-02 DIAGNOSIS — I482 Chronic atrial fibrillation, unspecified: Secondary | ICD-10-CM

## 2017-09-02 DIAGNOSIS — Z5181 Encounter for therapeutic drug level monitoring: Secondary | ICD-10-CM | POA: Diagnosis not present

## 2017-09-02 LAB — POCT INR: INR: 2.2 (ref 2.0–3.0)

## 2017-09-02 NOTE — Patient Instructions (Signed)
Description   Continue 4 mg daily. Recheck INR in in 10 days in the [place] office. Please call the office with any concerns or medication changes [phone].

## 2017-09-11 ENCOUNTER — Ambulatory Visit (INDEPENDENT_AMBULATORY_CARE_PROVIDER_SITE_OTHER): Payer: Medicare Other

## 2017-09-11 DIAGNOSIS — Z5181 Encounter for therapeutic drug level monitoring: Secondary | ICD-10-CM | POA: Diagnosis not present

## 2017-09-11 DIAGNOSIS — I482 Chronic atrial fibrillation, unspecified: Secondary | ICD-10-CM

## 2017-09-11 LAB — POCT INR: INR: 3.2 — AB (ref 2.0–3.0)

## 2017-09-11 NOTE — Patient Instructions (Signed)
Description   Take 2 mg today then continue 4 mg daily. Recheck INR in in 10 days in the [place] office. Please call the office with any concerns or medication changes [phone].

## 2017-09-16 DIAGNOSIS — L570 Actinic keratosis: Secondary | ICD-10-CM | POA: Diagnosis not present

## 2017-09-22 ENCOUNTER — Ambulatory Visit (INDEPENDENT_AMBULATORY_CARE_PROVIDER_SITE_OTHER): Payer: Medicare Other | Admitting: Pharmacist

## 2017-09-22 DIAGNOSIS — I482 Chronic atrial fibrillation, unspecified: Secondary | ICD-10-CM

## 2017-09-22 DIAGNOSIS — Z5181 Encounter for therapeutic drug level monitoring: Secondary | ICD-10-CM

## 2017-09-22 LAB — POCT INR: INR: 3.9 — AB (ref 2.0–3.0)

## 2017-09-22 NOTE — Patient Instructions (Signed)
Description    Skip dose tomorrow (since have already taken today) then start taking 4 mg daily, except 2mg  each Wednesday. Recheck INR in 7 days in the [place] office. Please call the office with any concerns or medication changes [phone].

## 2017-09-30 DIAGNOSIS — S60512A Abrasion of left hand, initial encounter: Secondary | ICD-10-CM | POA: Diagnosis not present

## 2017-09-30 DIAGNOSIS — Z23 Encounter for immunization: Secondary | ICD-10-CM | POA: Diagnosis not present

## 2017-09-30 DIAGNOSIS — T148XXA Other injury of unspecified body region, initial encounter: Secondary | ICD-10-CM | POA: Diagnosis not present

## 2017-10-06 ENCOUNTER — Ambulatory Visit (INDEPENDENT_AMBULATORY_CARE_PROVIDER_SITE_OTHER): Payer: Medicare Other | Admitting: Pharmacist

## 2017-10-06 DIAGNOSIS — Z5181 Encounter for therapeutic drug level monitoring: Secondary | ICD-10-CM

## 2017-10-06 DIAGNOSIS — I482 Chronic atrial fibrillation, unspecified: Secondary | ICD-10-CM

## 2017-10-06 LAB — POCT INR: INR: 2.8 (ref 2.0–3.0)

## 2017-10-06 NOTE — Patient Instructions (Signed)
Description   Continue taking 4 mg daily. Recheck INR in 3 weeks. Please call the office with any concerns or medication changes [phone].

## 2017-10-19 DIAGNOSIS — G3184 Mild cognitive impairment, so stated: Secondary | ICD-10-CM | POA: Diagnosis not present

## 2017-10-19 DIAGNOSIS — I2510 Atherosclerotic heart disease of native coronary artery without angina pectoris: Secondary | ICD-10-CM | POA: Diagnosis not present

## 2017-10-19 DIAGNOSIS — E782 Mixed hyperlipidemia: Secondary | ICD-10-CM | POA: Diagnosis not present

## 2017-10-19 DIAGNOSIS — I4891 Unspecified atrial fibrillation: Secondary | ICD-10-CM | POA: Diagnosis not present

## 2017-10-19 DIAGNOSIS — Z0000 Encounter for general adult medical examination without abnormal findings: Secondary | ICD-10-CM | POA: Diagnosis not present

## 2017-10-27 ENCOUNTER — Ambulatory Visit (INDEPENDENT_AMBULATORY_CARE_PROVIDER_SITE_OTHER): Payer: Medicare Other | Admitting: Pharmacist

## 2017-10-27 DIAGNOSIS — Z5181 Encounter for therapeutic drug level monitoring: Secondary | ICD-10-CM

## 2017-10-27 DIAGNOSIS — I482 Chronic atrial fibrillation, unspecified: Secondary | ICD-10-CM

## 2017-10-27 LAB — POCT INR: INR: 2.2 (ref 2.0–3.0)

## 2017-10-27 NOTE — Patient Instructions (Signed)
Description   Continue taking 4 mg daily, except 1/2 tablet on Wednesdays. Recheck INR in 4 weeks. Please call the office with any concerns or medication changes [phone].

## 2017-11-04 DIAGNOSIS — D3131 Benign neoplasm of right choroid: Secondary | ICD-10-CM | POA: Diagnosis not present

## 2017-11-24 ENCOUNTER — Telehealth: Payer: Self-pay | Admitting: Emergency Medicine

## 2017-11-24 DIAGNOSIS — Z5181 Encounter for therapeutic drug level monitoring: Secondary | ICD-10-CM

## 2017-11-24 NOTE — Telephone Encounter (Signed)
Patient coming in office for Pt/inr check due to being unable to come this afternoon. Per Dr. [name] pt/inr blood draw ordered and will forward results to coumadin clinic.

## 2017-11-25 LAB — PROTIME-INR
INR: 1.7 — AB (ref 0.8–1.2)
Prothrombin Time: 17 s — ABNORMAL HIGH (ref 9.1–12.0)

## 2017-11-27 ENCOUNTER — Ambulatory Visit (INDEPENDENT_AMBULATORY_CARE_PROVIDER_SITE_OTHER): Payer: Medicare Other | Admitting: Pharmacist

## 2017-11-27 DIAGNOSIS — Z5181 Encounter for therapeutic drug level monitoring: Secondary | ICD-10-CM | POA: Diagnosis not present

## 2017-11-27 DIAGNOSIS — I482 Chronic atrial fibrillation, unspecified: Secondary | ICD-10-CM | POA: Diagnosis not present

## 2017-12-15 DIAGNOSIS — L570 Actinic keratosis: Secondary | ICD-10-CM | POA: Diagnosis not present

## 2017-12-22 ENCOUNTER — Ambulatory Visit (INDEPENDENT_AMBULATORY_CARE_PROVIDER_SITE_OTHER): Payer: Medicare Other | Admitting: Pharmacist

## 2017-12-22 DIAGNOSIS — Z5181 Encounter for therapeutic drug level monitoring: Secondary | ICD-10-CM

## 2017-12-22 DIAGNOSIS — I482 Chronic atrial fibrillation, unspecified: Secondary | ICD-10-CM | POA: Diagnosis not present

## 2017-12-22 LAB — POCT INR: INR: 1.4 — AB (ref 2.0–3.0)

## 2017-12-22 NOTE — Patient Instructions (Signed)
Description   Take 1.5 tablets today then start taking 1 tablet every day . Recheck INR in 1 week. Please call the office with any concerns or medication changes [phone].

## 2017-12-29 ENCOUNTER — Ambulatory Visit (INDEPENDENT_AMBULATORY_CARE_PROVIDER_SITE_OTHER): Payer: Medicare Other

## 2017-12-29 DIAGNOSIS — Z5181 Encounter for therapeutic drug level monitoring: Secondary | ICD-10-CM | POA: Diagnosis not present

## 2017-12-29 DIAGNOSIS — I482 Chronic atrial fibrillation, unspecified: Secondary | ICD-10-CM | POA: Diagnosis not present

## 2017-12-29 LAB — POCT INR: INR: 1.5 — AB (ref 2.0–3.0)

## 2017-12-29 NOTE — Patient Instructions (Addendum)
Description   Take 2 tablets today then change your dose to 1 tablet every day and 1.5 tablets on Friday's. Recheck INR in 1 week. Please call the office with any concerns or medication changes [phone].

## 2018-01-05 ENCOUNTER — Ambulatory Visit (INDEPENDENT_AMBULATORY_CARE_PROVIDER_SITE_OTHER): Payer: Medicare Other | Admitting: *Deleted

## 2018-01-05 DIAGNOSIS — Z5181 Encounter for therapeutic drug level monitoring: Secondary | ICD-10-CM | POA: Diagnosis not present

## 2018-01-05 DIAGNOSIS — I482 Chronic atrial fibrillation, unspecified: Secondary | ICD-10-CM | POA: Diagnosis not present

## 2018-01-05 LAB — POCT INR: INR: 2.1 (ref 2.0–3.0)

## 2018-01-05 NOTE — Patient Instructions (Addendum)
Description   Continue taking 1 tablet every day and 1.5 tablets on Fridays. Recheck INR in 2 weeks. Please call the office with any concerns or medication changes [phone]) [phone].

## 2018-01-19 ENCOUNTER — Ambulatory Visit (INDEPENDENT_AMBULATORY_CARE_PROVIDER_SITE_OTHER): Payer: Medicare Other | Admitting: Pharmacist

## 2018-01-19 DIAGNOSIS — Z5181 Encounter for therapeutic drug level monitoring: Secondary | ICD-10-CM

## 2018-01-19 DIAGNOSIS — I482 Chronic atrial fibrillation, unspecified: Secondary | ICD-10-CM | POA: Diagnosis not present

## 2018-01-19 LAB — POCT INR: INR: 2 (ref 2.0–3.0)

## 2018-01-19 NOTE — Patient Instructions (Signed)
Description   Continue taking 1 tablet every day and 1.5 tablets on Fridays. Recheck INR in 3 weeks. Please call the office with any concerns or medication changes [phone].

## 2018-02-09 ENCOUNTER — Ambulatory Visit (INDEPENDENT_AMBULATORY_CARE_PROVIDER_SITE_OTHER): Payer: Medicare Other | Admitting: *Deleted

## 2018-02-09 DIAGNOSIS — Z5181 Encounter for therapeutic drug level monitoring: Secondary | ICD-10-CM

## 2018-02-09 DIAGNOSIS — I482 Chronic atrial fibrillation, unspecified: Secondary | ICD-10-CM | POA: Diagnosis not present

## 2018-02-09 LAB — POCT INR: INR: 1.6 — AB (ref 2.0–3.0)

## 2018-02-09 NOTE — Patient Instructions (Signed)
Description   Today take 1.5 tablets, then change your dose to 1 tablet every day and 1.5 tablets on Mondays and Fridays. Recheck INR in 2 weeks. Please call the office with any concerns or medication changes [phone].

## 2018-02-23 ENCOUNTER — Telehealth: Payer: Self-pay | Admitting: Pharmacist

## 2018-02-23 ENCOUNTER — Ambulatory Visit (INDEPENDENT_AMBULATORY_CARE_PROVIDER_SITE_OTHER): Payer: Medicare Other | Admitting: *Deleted

## 2018-02-23 DIAGNOSIS — Z5181 Encounter for therapeutic drug level monitoring: Secondary | ICD-10-CM

## 2018-02-23 DIAGNOSIS — I482 Chronic atrial fibrillation, unspecified: Secondary | ICD-10-CM

## 2018-02-23 LAB — POCT INR: INR: 1.7 — AB (ref 2.0–3.0)

## 2018-02-23 NOTE — Patient Instructions (Signed)
Description   Today take 1.5 tablets, then change your dose to 1 tablet every day and 1.5 tablets on Mondays, Wednesdays and Fridays. Take last dose on 02/25/18, Restart after Colonoscopy per Dr. Carmie End instructions, take an extra 1/2 tablet with normal dose for 2 days then resume new normal dosage.  Recheck INR in 3 weeks. Please call the office with any concerns or medication changes [phone].

## 2018-02-23 NOTE — Telephone Encounter (Signed)
Pt scheduled for colonoscopy on 03/03/18, has been instructed to hold Coumadin for 5 days per Dr Carmie End office. They did not send a clearance request.  Patient takes warfarin for afib with CHADS2VASc score of 4 (age x2, HTN, CAD). Ok to hold warfarin for 5 days prior to procedure.

## 2018-03-02 DIAGNOSIS — Z1211 Encounter for screening for malignant neoplasm of colon: Secondary | ICD-10-CM | POA: Diagnosis not present

## 2018-03-02 DIAGNOSIS — Z7901 Long term (current) use of anticoagulants: Secondary | ICD-10-CM | POA: Diagnosis not present

## 2018-03-03 DIAGNOSIS — D120 Benign neoplasm of cecum: Secondary | ICD-10-CM | POA: Diagnosis not present

## 2018-03-03 DIAGNOSIS — K635 Polyp of colon: Secondary | ICD-10-CM | POA: Diagnosis not present

## 2018-03-03 DIAGNOSIS — Z1211 Encounter for screening for malignant neoplasm of colon: Secondary | ICD-10-CM | POA: Diagnosis not present

## 2018-03-03 DIAGNOSIS — K5730 Diverticulosis of large intestine without perforation or abscess without bleeding: Secondary | ICD-10-CM | POA: Diagnosis not present

## 2018-03-16 ENCOUNTER — Ambulatory Visit (INDEPENDENT_AMBULATORY_CARE_PROVIDER_SITE_OTHER): Payer: Medicare Other | Admitting: Pharmacist

## 2018-03-16 DIAGNOSIS — I482 Chronic atrial fibrillation, unspecified: Secondary | ICD-10-CM | POA: Diagnosis not present

## 2018-03-16 DIAGNOSIS — Z5181 Encounter for therapeutic drug level monitoring: Secondary | ICD-10-CM

## 2018-03-16 LAB — POCT INR: INR: 2.2 (ref 2.0–3.0)

## 2018-03-16 NOTE — Patient Instructions (Signed)
Description   Continue 1 tablet every day and 1.5 tablets on Mondays, Wednesdays and Fridays. Recheck INR in 4 weeks. Please call the office with any concerns or medication changes [phone].

## 2018-03-20 DIAGNOSIS — S134XXA Sprain of ligaments of cervical spine, initial encounter: Secondary | ICD-10-CM | POA: Diagnosis not present

## 2018-03-29 DIAGNOSIS — G4762 Sleep related leg cramps: Secondary | ICD-10-CM | POA: Diagnosis not present

## 2018-03-29 DIAGNOSIS — S161XXA Strain of muscle, fascia and tendon at neck level, initial encounter: Secondary | ICD-10-CM | POA: Diagnosis not present

## 2018-03-29 DIAGNOSIS — Z6828 Body mass index (BMI) 28.0-28.9, adult: Secondary | ICD-10-CM | POA: Diagnosis not present

## 2018-03-31 DIAGNOSIS — M9901 Segmental and somatic dysfunction of cervical region: Secondary | ICD-10-CM | POA: Diagnosis not present

## 2018-03-31 DIAGNOSIS — M9902 Segmental and somatic dysfunction of thoracic region: Secondary | ICD-10-CM | POA: Diagnosis not present

## 2018-03-31 DIAGNOSIS — M542 Cervicalgia: Secondary | ICD-10-CM | POA: Diagnosis not present

## 2018-04-02 DIAGNOSIS — M542 Cervicalgia: Secondary | ICD-10-CM | POA: Diagnosis not present

## 2018-04-02 DIAGNOSIS — M9902 Segmental and somatic dysfunction of thoracic region: Secondary | ICD-10-CM | POA: Diagnosis not present

## 2018-04-02 DIAGNOSIS — M9901 Segmental and somatic dysfunction of cervical region: Secondary | ICD-10-CM | POA: Diagnosis not present

## 2018-04-05 DIAGNOSIS — M542 Cervicalgia: Secondary | ICD-10-CM | POA: Diagnosis not present

## 2018-04-05 DIAGNOSIS — M9902 Segmental and somatic dysfunction of thoracic region: Secondary | ICD-10-CM | POA: Diagnosis not present

## 2018-04-05 DIAGNOSIS — M9901 Segmental and somatic dysfunction of cervical region: Secondary | ICD-10-CM | POA: Diagnosis not present

## 2018-04-09 DIAGNOSIS — M9901 Segmental and somatic dysfunction of cervical region: Secondary | ICD-10-CM | POA: Diagnosis not present

## 2018-04-09 DIAGNOSIS — M9902 Segmental and somatic dysfunction of thoracic region: Secondary | ICD-10-CM | POA: Diagnosis not present

## 2018-04-09 DIAGNOSIS — M542 Cervicalgia: Secondary | ICD-10-CM | POA: Diagnosis not present

## 2018-04-12 ENCOUNTER — Telehealth: Payer: Self-pay

## 2018-04-12 NOTE — Telephone Encounter (Signed)

## 2018-04-12 NOTE — Telephone Encounter (Signed)
LMOM FOR PRESCREEN/DRIVE THRU 

## 2018-04-13 ENCOUNTER — Ambulatory Visit (INDEPENDENT_AMBULATORY_CARE_PROVIDER_SITE_OTHER): Payer: Medicare Other | Admitting: *Deleted

## 2018-04-13 ENCOUNTER — Other Ambulatory Visit: Payer: Self-pay

## 2018-04-13 DIAGNOSIS — Z5181 Encounter for therapeutic drug level monitoring: Secondary | ICD-10-CM

## 2018-04-13 DIAGNOSIS — I482 Chronic atrial fibrillation, unspecified: Secondary | ICD-10-CM | POA: Diagnosis not present

## 2018-04-13 LAB — POCT INR: INR: 2.6 (ref 2.0–3.0)

## 2018-05-03 DIAGNOSIS — Z6828 Body mass index (BMI) 28.0-28.9, adult: Secondary | ICD-10-CM | POA: Diagnosis not present

## 2018-05-03 DIAGNOSIS — S161XXD Strain of muscle, fascia and tendon at neck level, subsequent encounter: Secondary | ICD-10-CM | POA: Diagnosis not present

## 2018-05-05 DIAGNOSIS — M25561 Pain in right knee: Secondary | ICD-10-CM | POA: Diagnosis not present

## 2018-05-05 DIAGNOSIS — Z79899 Other long term (current) drug therapy: Secondary | ICD-10-CM | POA: Diagnosis not present

## 2018-05-05 DIAGNOSIS — R609 Edema, unspecified: Secondary | ICD-10-CM | POA: Diagnosis not present

## 2018-05-05 DIAGNOSIS — R52 Pain, unspecified: Secondary | ICD-10-CM | POA: Diagnosis not present

## 2018-05-05 DIAGNOSIS — M25461 Effusion, right knee: Secondary | ICD-10-CM | POA: Diagnosis not present

## 2018-05-05 DIAGNOSIS — Z7901 Long term (current) use of anticoagulants: Secondary | ICD-10-CM | POA: Diagnosis not present

## 2018-05-05 DIAGNOSIS — R0902 Hypoxemia: Secondary | ICD-10-CM | POA: Diagnosis not present

## 2018-05-10 ENCOUNTER — Telehealth: Payer: Self-pay

## 2018-05-10 DIAGNOSIS — M25461 Effusion, right knee: Secondary | ICD-10-CM | POA: Diagnosis not present

## 2018-05-10 DIAGNOSIS — Z6827 Body mass index (BMI) 27.0-27.9, adult: Secondary | ICD-10-CM | POA: Diagnosis not present

## 2018-05-10 DIAGNOSIS — G3184 Mild cognitive impairment, so stated: Secondary | ICD-10-CM | POA: Diagnosis not present

## 2018-05-10 NOTE — Telephone Encounter (Signed)
lmom for prescreen  

## 2018-05-10 NOTE — Telephone Encounter (Signed)

## 2018-05-11 ENCOUNTER — Other Ambulatory Visit: Payer: Self-pay

## 2018-05-11 ENCOUNTER — Ambulatory Visit (INDEPENDENT_AMBULATORY_CARE_PROVIDER_SITE_OTHER): Payer: Medicare Other | Admitting: *Deleted

## 2018-05-11 DIAGNOSIS — Z5181 Encounter for therapeutic drug level monitoring: Secondary | ICD-10-CM

## 2018-05-11 DIAGNOSIS — I482 Chronic atrial fibrillation, unspecified: Secondary | ICD-10-CM

## 2018-05-11 LAB — POCT INR: INR: 5 — AB (ref 2.0–3.0)

## 2018-05-11 NOTE — Patient Instructions (Signed)
Description   Spoke with wife and instructed pt to hold today and tomorrow's dose then continue taking the dose you have been taking which is 1 tablet daily.  Recheck INR in 2 weeks. Please call the office with any concerns or medication changes [phone].

## 2018-05-13 DIAGNOSIS — G3184 Mild cognitive impairment, so stated: Secondary | ICD-10-CM | POA: Diagnosis not present

## 2018-05-13 DIAGNOSIS — R413 Other amnesia: Secondary | ICD-10-CM | POA: Diagnosis not present

## 2018-05-13 DIAGNOSIS — R410 Disorientation, unspecified: Secondary | ICD-10-CM | POA: Diagnosis not present

## 2018-05-17 DIAGNOSIS — M25561 Pain in right knee: Secondary | ICD-10-CM | POA: Diagnosis not present

## 2018-05-18 DIAGNOSIS — L570 Actinic keratosis: Secondary | ICD-10-CM | POA: Diagnosis not present

## 2018-05-18 DIAGNOSIS — T7840XA Allergy, unspecified, initial encounter: Secondary | ICD-10-CM | POA: Diagnosis not present

## 2018-05-18 DIAGNOSIS — L821 Other seborrheic keratosis: Secondary | ICD-10-CM | POA: Diagnosis not present

## 2018-05-24 ENCOUNTER — Telehealth: Payer: Self-pay

## 2018-05-24 NOTE — Telephone Encounter (Signed)

## 2018-05-25 ENCOUNTER — Other Ambulatory Visit: Payer: Self-pay

## 2018-05-25 ENCOUNTER — Ambulatory Visit (INDEPENDENT_AMBULATORY_CARE_PROVIDER_SITE_OTHER): Payer: Medicare Other

## 2018-05-25 DIAGNOSIS — I482 Chronic atrial fibrillation, unspecified: Secondary | ICD-10-CM

## 2018-05-25 DIAGNOSIS — Z5181 Encounter for therapeutic drug level monitoring: Secondary | ICD-10-CM | POA: Diagnosis not present

## 2018-05-25 LAB — POCT INR: INR: 1.9 — AB (ref 2.0–3.0)

## 2018-05-25 NOTE — Patient Instructions (Signed)
Description   Spoke with wife and instructed to have pt to take 1.5 tablets today, then resume same dosage 1 tablet daily.  Recheck INR in 2 weeks. Please call the office with any concerns or medication changes [phone].

## 2018-06-04 ENCOUNTER — Telehealth: Payer: Self-pay

## 2018-06-04 NOTE — Telephone Encounter (Signed)
lmom for prescreen  

## 2018-06-08 ENCOUNTER — Other Ambulatory Visit: Payer: Self-pay

## 2018-06-08 ENCOUNTER — Ambulatory Visit (INDEPENDENT_AMBULATORY_CARE_PROVIDER_SITE_OTHER): Payer: Medicare Other | Admitting: *Deleted

## 2018-06-08 DIAGNOSIS — I482 Chronic atrial fibrillation, unspecified: Secondary | ICD-10-CM

## 2018-06-08 DIAGNOSIS — Z5181 Encounter for therapeutic drug level monitoring: Secondary | ICD-10-CM

## 2018-06-08 LAB — POCT INR: INR: 1.2 — AB (ref 2.0–3.0)

## 2018-06-08 NOTE — Patient Instructions (Signed)
Description    Today take 1.5 tablets, then start taking 1 tablet daily except 1.5 tablets on Wednesday. Rechck in one week. Call us with any questions. [phone].

## 2018-06-14 ENCOUNTER — Telehealth: Payer: Self-pay

## 2018-06-14 NOTE — Telephone Encounter (Signed)
lmom for prescreen  

## 2018-06-15 ENCOUNTER — Ambulatory Visit (INDEPENDENT_AMBULATORY_CARE_PROVIDER_SITE_OTHER): Payer: Medicare Other | Admitting: *Deleted

## 2018-06-15 ENCOUNTER — Other Ambulatory Visit: Payer: Self-pay

## 2018-06-15 DIAGNOSIS — I482 Chronic atrial fibrillation, unspecified: Secondary | ICD-10-CM | POA: Diagnosis not present

## 2018-06-15 DIAGNOSIS — Z5181 Encounter for therapeutic drug level monitoring: Secondary | ICD-10-CM | POA: Diagnosis not present

## 2018-06-15 LAB — POCT INR: INR: 1.4 — AB (ref 2.0–3.0)

## 2018-06-15 NOTE — Patient Instructions (Signed)
Description   Change your dose to 1 tablet everyday except 1.5 tablets on Tuesdays and Thursdays. Call us with any questions. [phone].

## 2018-06-23 ENCOUNTER — Telehealth: Payer: Self-pay

## 2018-06-23 NOTE — Telephone Encounter (Signed)
lmom for prescreen  

## 2018-06-28 NOTE — Telephone Encounter (Signed)

## 2018-06-29 ENCOUNTER — Other Ambulatory Visit: Payer: Self-pay

## 2018-06-29 ENCOUNTER — Ambulatory Visit (INDEPENDENT_AMBULATORY_CARE_PROVIDER_SITE_OTHER): Payer: Medicare Other | Admitting: *Deleted

## 2018-06-29 DIAGNOSIS — I482 Chronic atrial fibrillation, unspecified: Secondary | ICD-10-CM | POA: Diagnosis not present

## 2018-06-29 DIAGNOSIS — Z5181 Encounter for therapeutic drug level monitoring: Secondary | ICD-10-CM | POA: Diagnosis not present

## 2018-06-29 LAB — POCT INR: INR: 1.7 — AB (ref 2.0–3.0)

## 2018-06-29 NOTE — Patient Instructions (Signed)
Description   Today take an extra 1 tablet, Change your dose to 1 tablet everyday except 1.5 tablets on Tuesdays, Thursdays and Saturdays.  Call us with any questions. [phone]. Recheck in 2 weeks.

## 2018-07-06 ENCOUNTER — Telehealth: Payer: Self-pay

## 2018-07-06 NOTE — Telephone Encounter (Signed)

## 2018-07-13 ENCOUNTER — Other Ambulatory Visit: Payer: Self-pay

## 2018-07-13 ENCOUNTER — Ambulatory Visit (INDEPENDENT_AMBULATORY_CARE_PROVIDER_SITE_OTHER): Payer: Medicare Other | Admitting: *Deleted

## 2018-07-13 DIAGNOSIS — Z5181 Encounter for therapeutic drug level monitoring: Secondary | ICD-10-CM

## 2018-07-13 DIAGNOSIS — I482 Chronic atrial fibrillation, unspecified: Secondary | ICD-10-CM

## 2018-07-13 LAB — POCT INR: INR: 1.7 — AB (ref 2.0–3.0)

## 2018-07-13 NOTE — Patient Instructions (Signed)
Description   Today take 2 tablets, then change your dose to 1.5 tablets everyday except 1 tablet on Mondays, Wednesdays and Fridays.  Call us with any questions. [phone]. Recheck in 2 weeks.

## 2018-07-20 ENCOUNTER — Telehealth: Payer: Self-pay

## 2018-07-20 NOTE — Telephone Encounter (Signed)

## 2018-07-27 ENCOUNTER — Other Ambulatory Visit: Payer: Self-pay

## 2018-07-27 ENCOUNTER — Ambulatory Visit (INDEPENDENT_AMBULATORY_CARE_PROVIDER_SITE_OTHER): Payer: Medicare Other | Admitting: *Deleted

## 2018-07-27 DIAGNOSIS — Z5181 Encounter for therapeutic drug level monitoring: Secondary | ICD-10-CM

## 2018-07-27 DIAGNOSIS — I482 Chronic atrial fibrillation, unspecified: Secondary | ICD-10-CM

## 2018-07-27 LAB — POCT INR: INR: 3 (ref 2.0–3.0)

## 2018-07-27 NOTE — Patient Instructions (Signed)
Description   Continue taking 1.5 tablets everyday except 1 tablet on Mondays, Wednesdays and Fridays.  Call us with any questions. [phone]. Recheck in 2 weeks.

## 2018-08-10 ENCOUNTER — Ambulatory Visit (INDEPENDENT_AMBULATORY_CARE_PROVIDER_SITE_OTHER): Payer: Medicare Other | Admitting: *Deleted

## 2018-08-10 ENCOUNTER — Other Ambulatory Visit: Payer: Self-pay

## 2018-08-10 DIAGNOSIS — I482 Chronic atrial fibrillation, unspecified: Secondary | ICD-10-CM

## 2018-08-10 DIAGNOSIS — Z5181 Encounter for therapeutic drug level monitoring: Secondary | ICD-10-CM | POA: Diagnosis not present

## 2018-08-10 LAB — POCT INR: INR: 2.6 (ref 2.0–3.0)

## 2018-08-10 NOTE — Patient Instructions (Signed)
Description   Continue taking 1.5 tablets everyday except 1 tablet on Mondays, Wednesdays and Fridays.  Call us with any questions. [phone]. Recheck in 3 weeks.

## 2018-08-13 DIAGNOSIS — Z7901 Long term (current) use of anticoagulants: Secondary | ICD-10-CM | POA: Diagnosis not present

## 2018-08-13 DIAGNOSIS — Z6826 Body mass index (BMI) 26.0-26.9, adult: Secondary | ICD-10-CM | POA: Diagnosis not present

## 2018-08-13 DIAGNOSIS — T148XXA Other injury of unspecified body region, initial encounter: Secondary | ICD-10-CM | POA: Diagnosis not present

## 2018-08-31 ENCOUNTER — Ambulatory Visit (INDEPENDENT_AMBULATORY_CARE_PROVIDER_SITE_OTHER): Payer: Medicare Other | Admitting: *Deleted

## 2018-08-31 ENCOUNTER — Other Ambulatory Visit: Payer: Self-pay

## 2018-08-31 DIAGNOSIS — I482 Chronic atrial fibrillation, unspecified: Secondary | ICD-10-CM

## 2018-08-31 DIAGNOSIS — Z5181 Encounter for therapeutic drug level monitoring: Secondary | ICD-10-CM

## 2018-08-31 LAB — POCT INR: INR: 4.3 — AB (ref 2.0–3.0)

## 2018-08-31 NOTE — Patient Instructions (Signed)
Description   Skip today's dose, then Continue taking 1.5 tablets everyday except 1 tablet on Mondays, Wednesdays and Fridays.  Call us with any questions. [phone]. Recheck in 2 weeks.

## 2018-09-01 DIAGNOSIS — M1711 Unilateral primary osteoarthritis, right knee: Secondary | ICD-10-CM | POA: Diagnosis not present

## 2018-09-08 DIAGNOSIS — G3184 Mild cognitive impairment, so stated: Secondary | ICD-10-CM | POA: Diagnosis not present

## 2018-09-08 DIAGNOSIS — Z6826 Body mass index (BMI) 26.0-26.9, adult: Secondary | ICD-10-CM | POA: Diagnosis not present

## 2018-09-08 DIAGNOSIS — Z79899 Other long term (current) drug therapy: Secondary | ICD-10-CM | POA: Diagnosis not present

## 2018-09-08 DIAGNOSIS — E782 Mixed hyperlipidemia: Secondary | ICD-10-CM | POA: Diagnosis not present

## 2018-09-08 DIAGNOSIS — R42 Dizziness and giddiness: Secondary | ICD-10-CM | POA: Diagnosis not present

## 2018-09-13 DIAGNOSIS — I951 Orthostatic hypotension: Secondary | ICD-10-CM | POA: Diagnosis not present

## 2018-09-13 DIAGNOSIS — Z6826 Body mass index (BMI) 26.0-26.9, adult: Secondary | ICD-10-CM | POA: Diagnosis not present

## 2018-09-14 ENCOUNTER — Ambulatory Visit (INDEPENDENT_AMBULATORY_CARE_PROVIDER_SITE_OTHER): Payer: Medicare Other | Admitting: *Deleted

## 2018-09-14 ENCOUNTER — Other Ambulatory Visit: Payer: Self-pay

## 2018-09-14 DIAGNOSIS — Z5181 Encounter for therapeutic drug level monitoring: Secondary | ICD-10-CM | POA: Diagnosis not present

## 2018-09-14 DIAGNOSIS — I482 Chronic atrial fibrillation, unspecified: Secondary | ICD-10-CM

## 2018-09-14 LAB — POCT INR: INR: 2.6 (ref 2.0–3.0)

## 2018-09-14 NOTE — Patient Instructions (Signed)
Description   Continue taking 1.5 tablets everyday except 1 tablet on Mondays, Wednesdays and Fridays.  Call us with any questions. [phone]. Recheck in 3 weeks.      

## 2018-09-21 DIAGNOSIS — Z23 Encounter for immunization: Secondary | ICD-10-CM | POA: Diagnosis not present

## 2018-10-05 ENCOUNTER — Other Ambulatory Visit: Payer: Self-pay

## 2018-10-05 ENCOUNTER — Ambulatory Visit (INDEPENDENT_AMBULATORY_CARE_PROVIDER_SITE_OTHER): Payer: Medicare Other | Admitting: Pharmacist Clinician (PhC)/ Clinical Pharmacy Specialist

## 2018-10-05 DIAGNOSIS — Z5181 Encounter for therapeutic drug level monitoring: Secondary | ICD-10-CM | POA: Diagnosis not present

## 2018-10-05 DIAGNOSIS — I482 Chronic atrial fibrillation, unspecified: Secondary | ICD-10-CM | POA: Diagnosis not present

## 2018-10-05 LAB — POCT INR: INR: 2.2 (ref 2.0–3.0)

## 2018-10-05 NOTE — Patient Instructions (Signed)
Continue taking 1.5 tablets everyday except 1 tablet on Mondays, Wednesdays and Fridays.  Call us with any questions. [phone]. Recheck in 5 weeks.

## 2018-11-03 DIAGNOSIS — M1711 Unilateral primary osteoarthritis, right knee: Secondary | ICD-10-CM | POA: Diagnosis not present

## 2018-11-03 DIAGNOSIS — Z0000 Encounter for general adult medical examination without abnormal findings: Secondary | ICD-10-CM | POA: Diagnosis not present

## 2018-11-03 DIAGNOSIS — Z6827 Body mass index (BMI) 27.0-27.9, adult: Secondary | ICD-10-CM | POA: Diagnosis not present

## 2018-11-09 ENCOUNTER — Ambulatory Visit (INDEPENDENT_AMBULATORY_CARE_PROVIDER_SITE_OTHER): Payer: Medicare Other | Admitting: Pharmacist

## 2018-11-09 ENCOUNTER — Other Ambulatory Visit: Payer: Self-pay

## 2018-11-09 DIAGNOSIS — Z5181 Encounter for therapeutic drug level monitoring: Secondary | ICD-10-CM | POA: Diagnosis not present

## 2018-11-09 DIAGNOSIS — I482 Chronic atrial fibrillation, unspecified: Secondary | ICD-10-CM

## 2018-11-09 LAB — POCT INR: INR: 3.1 — AB (ref 2.0–3.0)

## 2018-11-11 DIAGNOSIS — M25561 Pain in right knee: Secondary | ICD-10-CM | POA: Diagnosis not present

## 2018-11-11 DIAGNOSIS — M1711 Unilateral primary osteoarthritis, right knee: Secondary | ICD-10-CM | POA: Diagnosis not present

## 2018-11-16 ENCOUNTER — Telehealth: Payer: Self-pay | Admitting: Emergency Medicine

## 2018-11-16 NOTE — Telephone Encounter (Signed)
Left message for patient to return call regarding surgery clearance.

## 2018-11-22 DIAGNOSIS — Z20828 Contact with and (suspected) exposure to other viral communicable diseases: Secondary | ICD-10-CM | POA: Diagnosis not present

## 2018-12-04 DIAGNOSIS — L219 Seborrheic dermatitis, unspecified: Secondary | ICD-10-CM | POA: Diagnosis not present

## 2018-12-04 DIAGNOSIS — L299 Pruritus, unspecified: Secondary | ICD-10-CM | POA: Diagnosis not present

## 2018-12-04 DIAGNOSIS — L570 Actinic keratosis: Secondary | ICD-10-CM | POA: Diagnosis not present

## 2018-12-13 ENCOUNTER — Other Ambulatory Visit: Payer: Self-pay | Admitting: Cardiology

## 2018-12-13 MED ORDER — WARFARIN SODIUM 4 MG PO TABS: 4.0000 mg | ORAL_TABLET | ORAL | 0 refills | Status: DC

## 2018-12-13 NOTE — Telephone Encounter (Signed)
°*  STAT* If patient is at the pharmacy, call can be transferred to refill team.   1. Which medications need to be refilled? (please list name of each medication and dose if known) coumadin 4mg   2. Which pharmacy/location (including street and city if local pharmacy) is medication to be sent to? [facility] on [address] [place]  3. Do they need a 30 day or 90 day supply? [place]

## 2018-12-14 ENCOUNTER — Ambulatory Visit (INDEPENDENT_AMBULATORY_CARE_PROVIDER_SITE_OTHER): Payer: Medicare Other | Admitting: Pharmacist Clinician (PhC)/ Clinical Pharmacy Specialist

## 2018-12-14 ENCOUNTER — Ambulatory Visit: Admit: 2018-12-14 | Payer: Medicare Other | Admitting: Orthopedic Surgery

## 2018-12-14 ENCOUNTER — Other Ambulatory Visit: Payer: Self-pay

## 2018-12-14 DIAGNOSIS — Z5181 Encounter for therapeutic drug level monitoring: Secondary | ICD-10-CM

## 2018-12-14 DIAGNOSIS — I482 Chronic atrial fibrillation, unspecified: Secondary | ICD-10-CM | POA: Diagnosis not present

## 2018-12-14 LAB — POCT INR: INR: 3.4 — AB (ref 2.0–3.0)

## 2018-12-14 SURGERY — ARTHROPLASTY, KNEE, TOTAL
Anesthesia: Spinal | Site: Knee | Laterality: Right

## 2018-12-28 ENCOUNTER — Ambulatory Visit (INDEPENDENT_AMBULATORY_CARE_PROVIDER_SITE_OTHER): Payer: Medicare Other | Admitting: Pharmacist Clinician (PhC)/ Clinical Pharmacy Specialist

## 2018-12-28 ENCOUNTER — Other Ambulatory Visit: Payer: Self-pay

## 2018-12-28 DIAGNOSIS — I482 Chronic atrial fibrillation, unspecified: Secondary | ICD-10-CM | POA: Diagnosis not present

## 2018-12-28 DIAGNOSIS — Z5181 Encounter for therapeutic drug level monitoring: Secondary | ICD-10-CM | POA: Diagnosis not present

## 2018-12-28 LAB — POCT INR: INR: 1.9 — AB (ref 2.0–3.0)

## 2019-01-25 ENCOUNTER — Other Ambulatory Visit: Payer: Self-pay

## 2019-01-25 ENCOUNTER — Ambulatory Visit (INDEPENDENT_AMBULATORY_CARE_PROVIDER_SITE_OTHER): Payer: Medicare Other | Admitting: *Deleted

## 2019-01-25 DIAGNOSIS — Z5181 Encounter for therapeutic drug level monitoring: Secondary | ICD-10-CM | POA: Diagnosis not present

## 2019-01-25 DIAGNOSIS — I482 Chronic atrial fibrillation, unspecified: Secondary | ICD-10-CM | POA: Diagnosis not present

## 2019-01-25 LAB — POCT INR: INR: 3.8 — AB (ref 2.0–3.0)

## 2019-01-25 NOTE — Patient Instructions (Signed)
Hold warfarin tonight then decrease dose to 1 tablet daily except 1 1/2 tablets on Sundays. Repeat INR in 2 weeks

## 2019-02-08 ENCOUNTER — Other Ambulatory Visit: Payer: Self-pay

## 2019-02-08 ENCOUNTER — Ambulatory Visit (INDEPENDENT_AMBULATORY_CARE_PROVIDER_SITE_OTHER): Payer: Medicare Other | Admitting: Pharmacist Clinician (PhC)/ Clinical Pharmacy Specialist

## 2019-02-08 DIAGNOSIS — I482 Chronic atrial fibrillation, unspecified: Secondary | ICD-10-CM | POA: Diagnosis not present

## 2019-02-08 DIAGNOSIS — Z5181 Encounter for therapeutic drug level monitoring: Secondary | ICD-10-CM | POA: Diagnosis not present

## 2019-02-08 LAB — POCT INR: INR: 2.5 (ref 2.0–3.0)

## 2019-03-08 ENCOUNTER — Ambulatory Visit (INDEPENDENT_AMBULATORY_CARE_PROVIDER_SITE_OTHER): Payer: Medicare Other | Admitting: Pharmacist Clinician (PhC)/ Clinical Pharmacy Specialist

## 2019-03-08 ENCOUNTER — Other Ambulatory Visit: Payer: Self-pay

## 2019-03-08 ENCOUNTER — Other Ambulatory Visit: Payer: Self-pay | Admitting: Cardiology

## 2019-03-08 DIAGNOSIS — I482 Chronic atrial fibrillation, unspecified: Secondary | ICD-10-CM | POA: Diagnosis not present

## 2019-03-08 DIAGNOSIS — Z5181 Encounter for therapeutic drug level monitoring: Secondary | ICD-10-CM | POA: Diagnosis not present

## 2019-03-08 LAB — POCT INR: INR: 3 (ref 2.0–3.0)

## 2019-04-12 ENCOUNTER — Other Ambulatory Visit: Payer: Self-pay

## 2019-04-12 ENCOUNTER — Ambulatory Visit (INDEPENDENT_AMBULATORY_CARE_PROVIDER_SITE_OTHER): Payer: Medicare Other | Admitting: *Deleted

## 2019-04-12 DIAGNOSIS — I482 Chronic atrial fibrillation, unspecified: Secondary | ICD-10-CM

## 2019-04-12 DIAGNOSIS — Z5181 Encounter for therapeutic drug level monitoring: Secondary | ICD-10-CM

## 2019-04-12 LAB — POCT INR
INR: 1.8 — AB (ref 2.0–3.0)
INR: 2.3 (ref 2.0–3.0)

## 2019-04-12 NOTE — Patient Instructions (Signed)
Continue warfarin 1 tablet daily except 1 1/2 tablets on Sundays. Repeat INR in 6 weeks  

## 2019-04-14 NOTE — Telephone Encounter (Signed)
Left message for patient to return call regarding clearance.

## 2019-04-25 NOTE — Telephone Encounter (Signed)
Left another message for patient to return call.

## 2019-05-24 ENCOUNTER — Other Ambulatory Visit: Payer: Self-pay

## 2019-05-24 ENCOUNTER — Ambulatory Visit (INDEPENDENT_AMBULATORY_CARE_PROVIDER_SITE_OTHER): Payer: Medicare Other | Admitting: *Deleted

## 2019-05-24 DIAGNOSIS — I482 Chronic atrial fibrillation, unspecified: Secondary | ICD-10-CM | POA: Diagnosis not present

## 2019-05-24 DIAGNOSIS — Z5181 Encounter for therapeutic drug level monitoring: Secondary | ICD-10-CM

## 2019-05-24 LAB — POCT INR: INR: 1.6 — AB (ref 2.0–3.0)

## 2019-05-24 NOTE — Patient Instructions (Signed)
Take warfarin 2 tablets tonight, 1 1/2 tablets tomorrow night then resume 1 tablet daily except 1 1/2 tablets on Sundays. Repeat INR in 3 weeks

## 2019-06-02 ENCOUNTER — Telehealth: Payer: Self-pay

## 2019-06-02 NOTE — Telephone Encounter (Signed)
Error

## 2019-06-07 DIAGNOSIS — C44629 Squamous cell carcinoma of skin of left upper limb, including shoulder: Secondary | ICD-10-CM | POA: Diagnosis not present

## 2019-06-07 DIAGNOSIS — L570 Actinic keratosis: Secondary | ICD-10-CM | POA: Diagnosis not present

## 2019-06-12 ENCOUNTER — Other Ambulatory Visit: Payer: Self-pay | Admitting: Cardiology

## 2019-06-14 ENCOUNTER — Other Ambulatory Visit: Payer: Self-pay

## 2019-06-14 ENCOUNTER — Ambulatory Visit (INDEPENDENT_AMBULATORY_CARE_PROVIDER_SITE_OTHER): Payer: Medicare Other | Admitting: *Deleted

## 2019-06-14 DIAGNOSIS — I482 Chronic atrial fibrillation, unspecified: Secondary | ICD-10-CM | POA: Diagnosis not present

## 2019-06-14 DIAGNOSIS — Z5181 Encounter for therapeutic drug level monitoring: Secondary | ICD-10-CM | POA: Diagnosis not present

## 2019-06-14 LAB — POCT INR: INR: 2.3 (ref 2.0–3.0)

## 2019-06-14 NOTE — Patient Instructions (Signed)
Continue warfarin 1 tablet daily except 1 1/2 tablets on Sundays. Repeat INR in 4 weeks 

## 2019-06-23 DIAGNOSIS — M545 Low back pain: Secondary | ICD-10-CM | POA: Diagnosis not present

## 2019-06-23 DIAGNOSIS — Z6826 Body mass index (BMI) 26.0-26.9, adult: Secondary | ICD-10-CM | POA: Diagnosis not present

## 2019-07-12 ENCOUNTER — Other Ambulatory Visit: Payer: Self-pay

## 2019-07-12 ENCOUNTER — Ambulatory Visit (INDEPENDENT_AMBULATORY_CARE_PROVIDER_SITE_OTHER): Payer: Medicare Other | Admitting: *Deleted

## 2019-07-12 DIAGNOSIS — Z5181 Encounter for therapeutic drug level monitoring: Secondary | ICD-10-CM

## 2019-07-12 DIAGNOSIS — I482 Chronic atrial fibrillation, unspecified: Secondary | ICD-10-CM

## 2019-07-12 LAB — POCT INR: INR: 2.4 (ref 2.0–3.0)

## 2019-07-12 NOTE — Patient Instructions (Signed)
Continue warfarin 1 tablet daily except 1 1/2 tablets on Sundays. Repeat INR in 4 weeks

## 2019-08-16 ENCOUNTER — Ambulatory Visit (INDEPENDENT_AMBULATORY_CARE_PROVIDER_SITE_OTHER): Payer: Medicare Other

## 2019-08-16 ENCOUNTER — Other Ambulatory Visit: Payer: Self-pay

## 2019-08-16 DIAGNOSIS — Z5181 Encounter for therapeutic drug level monitoring: Secondary | ICD-10-CM | POA: Diagnosis not present

## 2019-08-16 DIAGNOSIS — I482 Chronic atrial fibrillation, unspecified: Secondary | ICD-10-CM | POA: Diagnosis not present

## 2019-08-16 LAB — POCT INR: INR: 2.2 (ref 2.0–3.0)

## 2019-08-16 NOTE — Patient Instructions (Signed)
Continue warfarin 1 tablet daily except 1 1/2 tablets on Sundays. Repeat INR in 6 weeks  

## 2019-08-30 DIAGNOSIS — Z23 Encounter for immunization: Secondary | ICD-10-CM | POA: Diagnosis not present

## 2019-09-27 ENCOUNTER — Ambulatory Visit (INDEPENDENT_AMBULATORY_CARE_PROVIDER_SITE_OTHER): Payer: Medicare Other

## 2019-09-27 ENCOUNTER — Other Ambulatory Visit: Payer: Self-pay

## 2019-09-27 DIAGNOSIS — I482 Chronic atrial fibrillation, unspecified: Secondary | ICD-10-CM | POA: Diagnosis not present

## 2019-09-27 DIAGNOSIS — Z5181 Encounter for therapeutic drug level monitoring: Secondary | ICD-10-CM

## 2019-09-27 LAB — POCT INR: INR: 2 (ref 2.0–3.0)

## 2019-09-27 NOTE — Patient Instructions (Signed)
Continue warfarin 1 tablet daily except 1 1/2 tablets on Sundays. Repeat INR in 6 weeks

## 2019-11-04 DIAGNOSIS — I4891 Unspecified atrial fibrillation: Secondary | ICD-10-CM | POA: Diagnosis not present

## 2019-11-04 DIAGNOSIS — G3184 Mild cognitive impairment, so stated: Secondary | ICD-10-CM | POA: Diagnosis not present

## 2019-11-04 DIAGNOSIS — Z79899 Other long term (current) drug therapy: Secondary | ICD-10-CM | POA: Diagnosis not present

## 2019-11-04 DIAGNOSIS — M1711 Unilateral primary osteoarthritis, right knee: Secondary | ICD-10-CM | POA: Diagnosis not present

## 2019-11-04 DIAGNOSIS — Z0000 Encounter for general adult medical examination without abnormal findings: Secondary | ICD-10-CM | POA: Diagnosis not present

## 2019-11-04 DIAGNOSIS — I2510 Atherosclerotic heart disease of native coronary artery without angina pectoris: Secondary | ICD-10-CM | POA: Diagnosis not present

## 2019-11-04 DIAGNOSIS — E782 Mixed hyperlipidemia: Secondary | ICD-10-CM | POA: Diagnosis not present

## 2019-11-08 ENCOUNTER — Ambulatory Visit (INDEPENDENT_AMBULATORY_CARE_PROVIDER_SITE_OTHER): Payer: Medicare Other | Admitting: Pharmacist

## 2019-11-08 ENCOUNTER — Other Ambulatory Visit: Payer: Self-pay

## 2019-11-08 DIAGNOSIS — Z5181 Encounter for therapeutic drug level monitoring: Secondary | ICD-10-CM | POA: Diagnosis not present

## 2019-11-08 DIAGNOSIS — I482 Chronic atrial fibrillation, unspecified: Secondary | ICD-10-CM

## 2019-11-08 LAB — POCT INR: INR: 1.9 — AB (ref 2.0–3.0)

## 2019-11-08 NOTE — Patient Instructions (Addendum)
Description   Continue warfarin 1 tablet daily except 1 1/2 tablets on Sundays. Repeat INR in 4 weeks  Consider dose increase if patient continues to eat more greens

## 2019-11-22 DIAGNOSIS — M25561 Pain in right knee: Secondary | ICD-10-CM | POA: Diagnosis not present

## 2019-11-22 DIAGNOSIS — M25562 Pain in left knee: Secondary | ICD-10-CM | POA: Diagnosis not present

## 2019-11-22 DIAGNOSIS — M1712 Unilateral primary osteoarthritis, left knee: Secondary | ICD-10-CM | POA: Diagnosis not present

## 2019-11-22 DIAGNOSIS — M25762 Osteophyte, left knee: Secondary | ICD-10-CM | POA: Diagnosis not present

## 2019-11-22 DIAGNOSIS — M170 Bilateral primary osteoarthritis of knee: Secondary | ICD-10-CM | POA: Diagnosis not present

## 2019-11-22 DIAGNOSIS — M1711 Unilateral primary osteoarthritis, right knee: Secondary | ICD-10-CM | POA: Diagnosis not present

## 2019-11-22 DIAGNOSIS — G8929 Other chronic pain: Secondary | ICD-10-CM | POA: Diagnosis not present

## 2019-11-22 DIAGNOSIS — M1129 Other chondrocalcinosis, multiple sites: Secondary | ICD-10-CM | POA: Diagnosis not present

## 2019-12-06 ENCOUNTER — Other Ambulatory Visit: Payer: Self-pay

## 2019-12-06 ENCOUNTER — Ambulatory Visit (INDEPENDENT_AMBULATORY_CARE_PROVIDER_SITE_OTHER): Payer: Medicare Other

## 2019-12-06 DIAGNOSIS — I482 Chronic atrial fibrillation, unspecified: Secondary | ICD-10-CM | POA: Diagnosis not present

## 2019-12-06 DIAGNOSIS — Z5181 Encounter for therapeutic drug level monitoring: Secondary | ICD-10-CM | POA: Diagnosis not present

## 2019-12-06 LAB — POCT INR: INR: 1.4 — AB (ref 2.0–3.0)

## 2019-12-06 NOTE — Patient Instructions (Signed)
Take 2 tablets today only and then increase to 1 tablet daily except 1 1/2 tablets on Sunday, Wednesday and Saturday. Repeat INR in 4 weeks

## 2019-12-21 DIAGNOSIS — L570 Actinic keratosis: Secondary | ICD-10-CM | POA: Diagnosis not present

## 2020-01-05 ENCOUNTER — Other Ambulatory Visit: Payer: Self-pay

## 2020-01-05 ENCOUNTER — Ambulatory Visit (INDEPENDENT_AMBULATORY_CARE_PROVIDER_SITE_OTHER): Payer: Medicare Other

## 2020-01-05 DIAGNOSIS — I482 Chronic atrial fibrillation, unspecified: Secondary | ICD-10-CM

## 2020-01-05 DIAGNOSIS — Z5181 Encounter for therapeutic drug level monitoring: Secondary | ICD-10-CM

## 2020-01-05 LAB — POCT INR: INR: 1.4 — AB (ref 2.0–3.0)

## 2020-01-05 NOTE — Patient Instructions (Signed)
Take 2 tablets today only and then continue taking 1 tablet daily except 1 1/2 tablets on Sunday, Wednesday and Saturday. Repeat INR in 4 weeks

## 2020-01-06 DIAGNOSIS — J189 Pneumonia, unspecified organism: Secondary | ICD-10-CM | POA: Diagnosis not present

## 2020-01-06 DIAGNOSIS — R0981 Nasal congestion: Secondary | ICD-10-CM | POA: Diagnosis not present

## 2020-01-06 DIAGNOSIS — I517 Cardiomegaly: Secondary | ICD-10-CM | POA: Diagnosis not present

## 2020-01-06 DIAGNOSIS — R519 Headache, unspecified: Secondary | ICD-10-CM | POA: Diagnosis not present

## 2020-01-06 DIAGNOSIS — R918 Other nonspecific abnormal finding of lung field: Secondary | ICD-10-CM | POA: Diagnosis not present

## 2020-01-06 DIAGNOSIS — Z20828 Contact with and (suspected) exposure to other viral communicable diseases: Secondary | ICD-10-CM | POA: Diagnosis not present

## 2020-01-06 DIAGNOSIS — R0600 Dyspnea, unspecified: Secondary | ICD-10-CM | POA: Diagnosis not present

## 2020-01-12 DIAGNOSIS — I4811 Longstanding persistent atrial fibrillation: Secondary | ICD-10-CM | POA: Diagnosis not present

## 2020-01-12 DIAGNOSIS — I2510 Atherosclerotic heart disease of native coronary artery without angina pectoris: Secondary | ICD-10-CM | POA: Diagnosis not present

## 2020-01-12 DIAGNOSIS — E782 Mixed hyperlipidemia: Secondary | ICD-10-CM | POA: Diagnosis not present

## 2020-01-14 ENCOUNTER — Other Ambulatory Visit: Payer: Self-pay | Admitting: Cardiology

## 2020-02-02 ENCOUNTER — Ambulatory Visit (INDEPENDENT_AMBULATORY_CARE_PROVIDER_SITE_OTHER): Payer: Medicare Other

## 2020-02-02 ENCOUNTER — Other Ambulatory Visit: Payer: Self-pay

## 2020-02-02 DIAGNOSIS — Z5181 Encounter for therapeutic drug level monitoring: Secondary | ICD-10-CM

## 2020-02-02 DIAGNOSIS — I482 Chronic atrial fibrillation, unspecified: Secondary | ICD-10-CM | POA: Diagnosis not present

## 2020-02-02 LAB — POCT INR: INR: 1.6 — AB (ref 2.0–3.0)

## 2020-02-02 NOTE — Patient Instructions (Signed)
Take 2 tablets today and then increase to 1.5 tablets daily except only 1 tablet on Wednesday. Repeat INR in 4 weeks

## 2020-02-28 ENCOUNTER — Other Ambulatory Visit: Payer: Self-pay

## 2020-02-28 ENCOUNTER — Ambulatory Visit (INDEPENDENT_AMBULATORY_CARE_PROVIDER_SITE_OTHER): Payer: Medicare Other

## 2020-02-28 DIAGNOSIS — Z5181 Encounter for therapeutic drug level monitoring: Secondary | ICD-10-CM | POA: Diagnosis not present

## 2020-02-28 DIAGNOSIS — I482 Chronic atrial fibrillation, unspecified: Secondary | ICD-10-CM | POA: Diagnosis not present

## 2020-02-28 LAB — POCT INR: INR: 1.7 — AB (ref 2.0–3.0)

## 2020-02-28 NOTE — Patient Instructions (Signed)
Take 2.5 tablets today only and then continue taking 1.5 tablets daily except only 1 tablet on Wednesday. Repeat INR in 4 weeks.  TAKE ONLY ONCE DAILY.

## 2020-03-12 DIAGNOSIS — Z7901 Long term (current) use of anticoagulants: Secondary | ICD-10-CM | POA: Diagnosis not present

## 2020-03-12 DIAGNOSIS — G3184 Mild cognitive impairment, so stated: Secondary | ICD-10-CM | POA: Diagnosis not present

## 2020-03-12 DIAGNOSIS — K921 Melena: Secondary | ICD-10-CM | POA: Diagnosis not present

## 2020-03-12 DIAGNOSIS — Z6823 Body mass index (BMI) 23.0-23.9, adult: Secondary | ICD-10-CM | POA: Diagnosis not present

## 2020-03-27 ENCOUNTER — Ambulatory Visit (INDEPENDENT_AMBULATORY_CARE_PROVIDER_SITE_OTHER): Payer: Medicare Other

## 2020-03-27 ENCOUNTER — Other Ambulatory Visit: Payer: Self-pay

## 2020-03-27 DIAGNOSIS — I482 Chronic atrial fibrillation, unspecified: Secondary | ICD-10-CM | POA: Diagnosis not present

## 2020-03-27 DIAGNOSIS — Z5181 Encounter for therapeutic drug level monitoring: Secondary | ICD-10-CM | POA: Diagnosis not present

## 2020-03-27 LAB — POCT INR: INR: 2.7 (ref 2.0–3.0)

## 2020-03-27 NOTE — Patient Instructions (Signed)
continue taking 1.5 tablets daily except only 1 tablet on Wednesday. Repeat INR in 6 weeks.  

## 2020-04-16 ENCOUNTER — Other Ambulatory Visit: Payer: Self-pay | Admitting: Cardiology

## 2020-05-08 ENCOUNTER — Ambulatory Visit (INDEPENDENT_AMBULATORY_CARE_PROVIDER_SITE_OTHER): Payer: Medicare Other

## 2020-05-08 ENCOUNTER — Other Ambulatory Visit: Payer: Self-pay

## 2020-05-08 DIAGNOSIS — Z5181 Encounter for therapeutic drug level monitoring: Secondary | ICD-10-CM | POA: Diagnosis not present

## 2020-05-08 DIAGNOSIS — I482 Chronic atrial fibrillation, unspecified: Secondary | ICD-10-CM

## 2020-05-08 LAB — POCT INR: INR: 2 (ref 2.0–3.0)

## 2020-05-08 NOTE — Patient Instructions (Signed)
continue taking 1.5 tablets daily except only 1 tablet on Wednesday. Repeat INR in 6 weeks.  

## 2020-05-17 DIAGNOSIS — R600 Localized edema: Secondary | ICD-10-CM | POA: Diagnosis not present

## 2020-05-17 DIAGNOSIS — Z7901 Long term (current) use of anticoagulants: Secondary | ICD-10-CM | POA: Diagnosis not present

## 2020-05-17 DIAGNOSIS — D649 Anemia, unspecified: Secondary | ICD-10-CM | POA: Diagnosis not present

## 2020-05-17 DIAGNOSIS — G3184 Mild cognitive impairment, so stated: Secondary | ICD-10-CM | POA: Diagnosis not present

## 2020-06-15 DIAGNOSIS — H6092 Unspecified otitis externa, left ear: Secondary | ICD-10-CM | POA: Diagnosis not present

## 2020-06-15 DIAGNOSIS — H6122 Impacted cerumen, left ear: Secondary | ICD-10-CM | POA: Diagnosis not present

## 2020-06-15 DIAGNOSIS — Z6826 Body mass index (BMI) 26.0-26.9, adult: Secondary | ICD-10-CM | POA: Diagnosis not present

## 2020-06-15 DIAGNOSIS — I5030 Unspecified diastolic (congestive) heart failure: Secondary | ICD-10-CM | POA: Diagnosis not present

## 2020-06-19 ENCOUNTER — Other Ambulatory Visit: Payer: Self-pay

## 2020-06-19 ENCOUNTER — Ambulatory Visit (INDEPENDENT_AMBULATORY_CARE_PROVIDER_SITE_OTHER): Payer: Medicare Other

## 2020-06-19 DIAGNOSIS — I482 Chronic atrial fibrillation, unspecified: Secondary | ICD-10-CM | POA: Diagnosis not present

## 2020-06-19 DIAGNOSIS — Z5181 Encounter for therapeutic drug level monitoring: Secondary | ICD-10-CM | POA: Diagnosis not present

## 2020-06-19 LAB — POCT INR: INR: 1.8 — AB (ref 2.0–3.0)

## 2020-06-19 NOTE — Patient Instructions (Signed)
Take 2 tablets today only and then continue taking 1.5 tablets daily except only 1 tablet on Wednesday. Repeat INR in 6 weeks.

## 2020-07-15 ENCOUNTER — Other Ambulatory Visit: Payer: Self-pay | Admitting: Cardiology

## 2020-07-19 DIAGNOSIS — Z20822 Contact with and (suspected) exposure to covid-19: Secondary | ICD-10-CM | POA: Diagnosis not present

## 2020-07-31 ENCOUNTER — Ambulatory Visit (INDEPENDENT_AMBULATORY_CARE_PROVIDER_SITE_OTHER): Payer: Medicare Other

## 2020-07-31 ENCOUNTER — Other Ambulatory Visit: Payer: Self-pay

## 2020-07-31 DIAGNOSIS — I482 Chronic atrial fibrillation, unspecified: Secondary | ICD-10-CM | POA: Diagnosis not present

## 2020-07-31 DIAGNOSIS — Z5181 Encounter for therapeutic drug level monitoring: Secondary | ICD-10-CM

## 2020-07-31 LAB — POCT INR: INR: 2.3 (ref 2.0–3.0)

## 2020-07-31 NOTE — Patient Instructions (Signed)
continue taking 1.5 tablets daily except only 1 tablet on Wednesday. Repeat INR in 6 weeks.

## 2020-08-09 DIAGNOSIS — R252 Cramp and spasm: Secondary | ICD-10-CM | POA: Diagnosis not present

## 2020-08-09 DIAGNOSIS — Z6826 Body mass index (BMI) 26.0-26.9, adult: Secondary | ICD-10-CM | POA: Diagnosis not present

## 2020-08-09 DIAGNOSIS — L820 Inflamed seborrheic keratosis: Secondary | ICD-10-CM | POA: Diagnosis not present

## 2020-08-29 DIAGNOSIS — D2239 Melanocytic nevi of other parts of face: Secondary | ICD-10-CM | POA: Diagnosis not present

## 2020-08-29 DIAGNOSIS — L570 Actinic keratosis: Secondary | ICD-10-CM | POA: Diagnosis not present

## 2020-08-29 DIAGNOSIS — D485 Neoplasm of uncertain behavior of skin: Secondary | ICD-10-CM | POA: Diagnosis not present

## 2020-08-29 DIAGNOSIS — L299 Pruritus, unspecified: Secondary | ICD-10-CM | POA: Diagnosis not present

## 2020-08-29 DIAGNOSIS — D225 Melanocytic nevi of trunk: Secondary | ICD-10-CM | POA: Diagnosis not present

## 2020-09-11 ENCOUNTER — Other Ambulatory Visit: Payer: Self-pay

## 2020-09-11 ENCOUNTER — Ambulatory Visit (INDEPENDENT_AMBULATORY_CARE_PROVIDER_SITE_OTHER): Payer: Medicare Other

## 2020-09-11 DIAGNOSIS — I482 Chronic atrial fibrillation, unspecified: Secondary | ICD-10-CM | POA: Diagnosis not present

## 2020-09-11 DIAGNOSIS — Z5181 Encounter for therapeutic drug level monitoring: Secondary | ICD-10-CM | POA: Diagnosis not present

## 2020-09-11 LAB — POCT INR: INR: 1.9 — AB (ref 2.0–3.0)

## 2020-09-11 NOTE — Patient Instructions (Signed)
Take 2 tablets today only and then continue taking 1.5 tablets daily except only 1 tablet on Wednesday. Repeat INR in 6 weeks.

## 2020-10-04 DIAGNOSIS — C44629 Squamous cell carcinoma of skin of left upper limb, including shoulder: Secondary | ICD-10-CM | POA: Diagnosis not present

## 2020-10-15 DIAGNOSIS — Z23 Encounter for immunization: Secondary | ICD-10-CM | POA: Diagnosis not present

## 2020-10-16 DIAGNOSIS — Z4889 Encounter for other specified surgical aftercare: Secondary | ICD-10-CM | POA: Diagnosis not present

## 2020-10-16 DIAGNOSIS — Z6826 Body mass index (BMI) 26.0-26.9, adult: Secondary | ICD-10-CM | POA: Diagnosis not present

## 2020-10-23 ENCOUNTER — Ambulatory Visit (INDEPENDENT_AMBULATORY_CARE_PROVIDER_SITE_OTHER): Payer: Medicare Other

## 2020-10-23 ENCOUNTER — Other Ambulatory Visit: Payer: Self-pay

## 2020-10-23 DIAGNOSIS — Z5181 Encounter for therapeutic drug level monitoring: Secondary | ICD-10-CM | POA: Diagnosis not present

## 2020-10-23 DIAGNOSIS — I482 Chronic atrial fibrillation, unspecified: Secondary | ICD-10-CM | POA: Diagnosis not present

## 2020-10-23 LAB — POCT INR: INR: 1.6 — AB (ref 2.0–3.0)

## 2020-10-23 NOTE — Patient Instructions (Addendum)
     Description   Take 2 tablets today only and then START taking 1.5 tablets daily. Repeat INR in 1 week.

## 2020-10-30 ENCOUNTER — Ambulatory Visit (INDEPENDENT_AMBULATORY_CARE_PROVIDER_SITE_OTHER): Payer: Medicare Other

## 2020-10-30 ENCOUNTER — Other Ambulatory Visit: Payer: Self-pay

## 2020-10-30 DIAGNOSIS — I482 Chronic atrial fibrillation, unspecified: Secondary | ICD-10-CM

## 2020-10-30 DIAGNOSIS — Z5181 Encounter for therapeutic drug level monitoring: Secondary | ICD-10-CM | POA: Diagnosis not present

## 2020-10-30 LAB — POCT INR: INR: 2.1 (ref 2.0–3.0)

## 2020-10-30 NOTE — Patient Instructions (Signed)
Description   Continue taking 1.5 tablets daily. Repeat INR in 3 weeks. [facility]in Clinic: [phone]

## 2020-11-20 ENCOUNTER — Other Ambulatory Visit: Payer: Self-pay

## 2020-11-20 ENCOUNTER — Ambulatory Visit (INDEPENDENT_AMBULATORY_CARE_PROVIDER_SITE_OTHER): Payer: Medicare Other

## 2020-11-20 DIAGNOSIS — Z5181 Encounter for therapeutic drug level monitoring: Secondary | ICD-10-CM

## 2020-11-20 DIAGNOSIS — I482 Chronic atrial fibrillation, unspecified: Secondary | ICD-10-CM | POA: Diagnosis not present

## 2020-11-20 LAB — POCT INR: INR: 1.6 — AB (ref 2.0–3.0)

## 2020-11-20 NOTE — Patient Instructions (Signed)
Description   Take 2 tablets today and 2 tablets tomorrow and then continue taking 1.5 tablets daily. Repeat INR in 2 weeks. [facility]in Clinic: [phone]

## 2020-11-21 DIAGNOSIS — M1711 Unilateral primary osteoarthritis, right knee: Secondary | ICD-10-CM | POA: Diagnosis not present

## 2020-11-23 DIAGNOSIS — M79605 Pain in left leg: Secondary | ICD-10-CM | POA: Diagnosis not present

## 2020-11-23 DIAGNOSIS — M545 Low back pain, unspecified: Secondary | ICD-10-CM | POA: Diagnosis not present

## 2020-11-23 DIAGNOSIS — Z6826 Body mass index (BMI) 26.0-26.9, adult: Secondary | ICD-10-CM | POA: Diagnosis not present

## 2020-11-28 DIAGNOSIS — Z0000 Encounter for general adult medical examination without abnormal findings: Secondary | ICD-10-CM | POA: Diagnosis not present

## 2020-11-28 DIAGNOSIS — M25562 Pain in left knee: Secondary | ICD-10-CM | POA: Diagnosis not present

## 2020-11-30 DIAGNOSIS — C44629 Squamous cell carcinoma of skin of left upper limb, including shoulder: Secondary | ICD-10-CM | POA: Diagnosis not present

## 2020-12-04 ENCOUNTER — Other Ambulatory Visit: Payer: Self-pay

## 2020-12-04 ENCOUNTER — Ambulatory Visit (INDEPENDENT_AMBULATORY_CARE_PROVIDER_SITE_OTHER): Payer: Medicare Other

## 2020-12-04 DIAGNOSIS — I482 Chronic atrial fibrillation, unspecified: Secondary | ICD-10-CM

## 2020-12-04 DIAGNOSIS — Z5181 Encounter for therapeutic drug level monitoring: Secondary | ICD-10-CM

## 2020-12-04 LAB — POCT INR: INR: 1.7 — AB (ref 2.0–3.0)

## 2020-12-04 NOTE — Patient Instructions (Signed)
Description   Take 2 tablets today and then START taking 1.5 tablets daily EXCEPT 2 tablets on Mondays and Thursdays. Repeat INR in 2 weeks. [facility] Clinic: [phone]

## 2020-12-14 DIAGNOSIS — M25562 Pain in left knee: Secondary | ICD-10-CM | POA: Diagnosis not present

## 2020-12-14 DIAGNOSIS — G3184 Mild cognitive impairment, so stated: Secondary | ICD-10-CM | POA: Diagnosis not present

## 2020-12-14 DIAGNOSIS — L989 Disorder of the skin and subcutaneous tissue, unspecified: Secondary | ICD-10-CM | POA: Diagnosis not present

## 2020-12-14 DIAGNOSIS — Z6824 Body mass index (BMI) 24.0-24.9, adult: Secondary | ICD-10-CM | POA: Diagnosis not present

## 2020-12-18 ENCOUNTER — Other Ambulatory Visit: Payer: Self-pay

## 2020-12-18 ENCOUNTER — Ambulatory Visit (INDEPENDENT_AMBULATORY_CARE_PROVIDER_SITE_OTHER): Payer: Medicare Other

## 2020-12-18 DIAGNOSIS — Z5181 Encounter for therapeutic drug level monitoring: Secondary | ICD-10-CM

## 2020-12-18 DIAGNOSIS — I482 Chronic atrial fibrillation, unspecified: Secondary | ICD-10-CM | POA: Diagnosis not present

## 2020-12-18 LAB — POCT INR: INR: 4.4 — AB (ref 2.0–3.0)

## 2020-12-18 NOTE — Patient Instructions (Signed)
Description   Hold today's dose and only take 0.5 tablet tomorrow and then START taking 1.5 tablets daily EXCEPT 2 tablets on Mondays. Repeat INR in 1 week. [facility]in Clinic: [phone]

## 2020-12-25 ENCOUNTER — Ambulatory Visit (INDEPENDENT_AMBULATORY_CARE_PROVIDER_SITE_OTHER): Payer: Medicare Other

## 2020-12-25 ENCOUNTER — Other Ambulatory Visit: Payer: Self-pay

## 2020-12-25 DIAGNOSIS — Z5181 Encounter for therapeutic drug level monitoring: Secondary | ICD-10-CM | POA: Diagnosis not present

## 2020-12-25 DIAGNOSIS — I482 Chronic atrial fibrillation, unspecified: Secondary | ICD-10-CM

## 2020-12-25 LAB — POCT INR: INR: 3.5 — AB (ref 2.0–3.0)

## 2020-12-25 NOTE — Patient Instructions (Signed)
Hold today's dose and then decrease to 1.5 tablets daily.  Repeat INR in 1 week. [facility]in Clinic: [phone]

## 2021-01-01 ENCOUNTER — Ambulatory Visit (INDEPENDENT_AMBULATORY_CARE_PROVIDER_SITE_OTHER): Payer: Medicare Other

## 2021-01-01 ENCOUNTER — Other Ambulatory Visit: Payer: Self-pay

## 2021-01-01 DIAGNOSIS — I482 Chronic atrial fibrillation, unspecified: Secondary | ICD-10-CM

## 2021-01-01 DIAGNOSIS — Z5181 Encounter for therapeutic drug level monitoring: Secondary | ICD-10-CM | POA: Diagnosis not present

## 2021-01-01 LAB — POCT INR: INR: 4.1 — AB (ref 2.0–3.0)

## 2021-01-01 NOTE — Patient Instructions (Signed)
Description   Hold today's dose and only take 1 tablet tomorrow and then decrease to 1.5 tablets daily EXCEPT 1 tablet on Sundays and Thursdays.  Repeat INR in 1 week. [facility]in Clinic: [phone]

## 2021-01-08 ENCOUNTER — Ambulatory Visit (INDEPENDENT_AMBULATORY_CARE_PROVIDER_SITE_OTHER): Payer: Medicare Other

## 2021-01-08 ENCOUNTER — Other Ambulatory Visit: Payer: Self-pay

## 2021-01-08 DIAGNOSIS — I482 Chronic atrial fibrillation, unspecified: Secondary | ICD-10-CM

## 2021-01-08 DIAGNOSIS — Z5181 Encounter for therapeutic drug level monitoring: Secondary | ICD-10-CM | POA: Diagnosis not present

## 2021-01-08 LAB — POCT INR: INR: 3.2 — AB (ref 2.0–3.0)

## 2021-01-08 NOTE — Patient Instructions (Signed)
Description   Eat greens today and then continue taking 1.5 tablets daily EXCEPT 1 tablet on Sundays and Thursdays. Repeat INR in 3 weeks. [facility]in Clinic: [phone]

## 2021-01-09 ENCOUNTER — Other Ambulatory Visit: Payer: Self-pay | Admitting: Cardiology

## 2021-01-09 NOTE — Telephone Encounter (Signed)
Prescription refill request received for warfarin Lov: 02/02/20 (Revankar)  Next INR check: 01/29/21 Warfarin tablet strength: 4mg   Appropriate dose and refill sent to requested pharmacy.

## 2021-01-10 ENCOUNTER — Ambulatory Visit (INDEPENDENT_AMBULATORY_CARE_PROVIDER_SITE_OTHER): Payer: Medicare Other | Admitting: Neurology

## 2021-01-10 ENCOUNTER — Encounter: Payer: Self-pay | Admitting: Neurology

## 2021-01-10 VITALS — HR 55 | Ht 73.0 in | Wt 181.0 lb

## 2021-01-10 DIAGNOSIS — F02A0 Dementia in other diseases classified elsewhere, mild, without behavioral disturbance, psychotic disturbance, mood disturbance, and anxiety: Secondary | ICD-10-CM

## 2021-01-10 DIAGNOSIS — G309 Alzheimer's disease, unspecified: Secondary | ICD-10-CM

## 2021-01-10 NOTE — Patient Instructions (Signed)
Continue current medications  Follow up with Dr Humphrey Rolls  Routine EEG  Return in 1 year      There are well-accepted and sensible ways to reduce risk for Alzheimers disease and other degenerative brain disorders .  Exercise Daily Walk A daily 20 minute walk should be part of your routine. Disease related apathy can be a significant roadblock to exercise and the only way to overcome this is to make it a daily routine and perhaps have a reward at the end (something your loved one loves to eat or drink perhaps) or a personal trainer coming to the home can also be very useful. Most importantly, the patient is much more likely to exercise if the caregiver / spouse does it with him/her. In general a structured, repetitive schedule is best.  General Health: Any diseases which effect your body will effect your brain such as a pneumonia, urinary infection, blood clot, heart attack or stroke. Keep contact with your primary care doctor for regular follow ups.  Sleep. A good nights sleep is healthy for the brain. Seven hours is recommended. If you have insomnia or poor sleep habits we can give you some instructions. If you have sleep apnea wear your mask.  Diet: Eating a heart healthy diet is also a good idea; fish and poultry instead of red meat, nuts (mostly non-peanuts), vegetables, fruits, olive oil or canola oil (instead of butter), minimal salt (use other spices to flavor foods), whole grain rice, bread, cereal and pasta and wine in moderation.Research is now showing that the MIND diet, which is a combination of The Mediterranean diet and the DASH diet, is beneficial for cognitive processing and longevity. Information about this diet can be found in The MIND Diet, a book by Doyne Keel, MS, RDN, and online at [employer].si  Finances, Power of Attorney and Advance Directives: You should consider putting legal safeguards in place with regard to financial and medical decision  making. While the spouse always has power of attorney for medical and financial issues in the absence of any form, you should consider what you want in case the spouse / caregiver is no longer around or capable of making decisions.     Heart-head connection  New research shows there are things we can do to reduce the risk of mild cognitive impairment and dementia.  Several conditions known to increase the risk of cardiovascular disease -- such as high blood pressure, diabetes and high cholesterol -- also increase the risk of developing Alzheimer's. Some autopsy studies show that as many as 37 percent of individuals with Alzheimer's disease also have cardiovascular disease.  A longstanding question is why some people develop hallmark Alzheimer's plaques and tangles but do not develop the symptoms of Alzheimer's. Vascular disease may help researchers eventually find an answer. Some autopsy studies suggest that plaques and tangles may be present in the brain without causing symptoms of cognitive decline unless the brain also shows evidence of vascular disease. More research is needed to better understand the link between vascular health and Alzheimer's.  Physical exercise and diet Regular physical exercise may be a beneficial strategy to lower the risk of Alzheimer's and vascular dementia. Exercise may directly benefit brain cells by increasing blood and oxygen flow in the brain. Because of its known cardiovascular benefits, a medically approved exercise program is a valuable part of any overall wellness plan.  Current evidence suggests that heart-healthy eating may also help protect the brain. Heart-healthy eating includes limiting the intake of sugar  and saturated fats and making sure to eat plenty of fruits, vegetables, and whole grains. No one diet is best. Two diets that have been studied and may be beneficial are the DASH (Dietary Approaches to Stop Hypertension) diet and the Mediterranean diet.  The DASH diet emphasizes vegetables, fruits and fat-free or low-fat dairy products; includes whole grains, fish, poultry, beans, seeds, nuts and vegetable oils; and limits sodium, sweets, sugary beverages and red meats. A Mediterranean diet includes relatively little red meat and emphasizes whole grains, fruits and vegetables, fish and shellfish, and nuts, olive oil and other healthy fats.  Social connections and intellectual activity A number of studies indicate that maintaining strong social connections and keeping mentally active as we age might lower the risk of cognitive decline and Alzheimer's. Experts are not certain about the reason for this association. It may be due to direct mechanisms through which social and mental stimulation strengthen connections between nerve cells in the brain.  Head trauma There appears to be a strong link between future risk of Alzheimer's and serious head trauma, especially when injury involves loss of consciousness. You can help reduce your risk of Alzheimer's by protecting your head.  Wear a seat belt  Use a helmet when participating in sports  "Fall-proof" your home   What you can do now While research is not yet conclusive, certain lifestyle choices, such as physical activity and diet, may help support brain health and prevent Alzheimer's. Many of these lifestyle changes have been shown to lower the risk of other diseases, like heart disease and diabetes, which have been linked to Alzheimer's. With few drawbacks and plenty of known benefits, healthy lifestyle choices can improve your health and possibly protect your brain.  Learn more about brain health. You can help increase our knowledge by considering participation in a clinical study. Our free clinical trial matching services, Trial[employer], can help you find clinical trials in your area that are seeking volunteers.

## 2021-01-10 NOTE — Progress Notes (Signed)
[facility]  PATIENT: Nicholas Pratt DOB: [birth year]  REQUESTING CLINICIAN: Mateo Flow, MD HISTORY FROM: Patient and family  REASON FOR VISIT: Dementia    HISTORICAL  CHIEF COMPLAINT:  Chief Complaint  Patient presents with   New Patient (Initial Visit)    Rm 12. Accompanied by wife and daughter. NP/Paper proficient/[facility]. Phys./memory loss.    HISTORY OF PRESENT ILLNESS:  This is a 80 year old gentleman with past medical history of atrial fibrillation, hypertension, hyperlipidemia and coronary artery disease who is presenting with family for memory problem for the past 3 to 4 years.  Per patient, he reported his memory is fine, he does not have any issue with memory, his only problem is his knee pain that prevent him from going golfing.   Per wife patient has been struggling with memory decline for the past 3 to 4 years, he is forgetful about recent conversations, he repeats himself multiple times and he asked the same questions over and over.  Wife has to remind him about important events like appointment multiple times.  Wife reported he loved to do word games on his phone but stopped doing it about 3 years ago.  He does not recall specific name or specific places but he will tend to generalize saying "you know what I am mean".  Sometimes, he is very confused. He still drives, denies any recent accident but only drive to familiar places meaning to the golf course and to his local hangout spot.  Wife reports he did have some weird dream but he does not act out his dreams.  Another thing that she mentioned is his mood swing, he gets irritable very easily.  Currently he is on Aricept 10 mg and also on Namenda 14 mg.   He had a head CT in 2020 showing atrophy with patchy supratentorial small vessel disease.    OTHER MEDICAL CONDITIONS: Afib, HLD, CAD   REVIEW OF SYSTEMS: Full 14 system review of systems performed and negative with exception  of: as noted in the HPI  ALLERGIES: Allergies  Allergen Reactions   Oxycodone Nausea And Vomiting    HOME MEDICATIONS: Outpatient Medications Prior to Visit  Medication Sig Dispense Refill   donepezil (ARICEPT) 5 MG tablet Take 10 mg by mouth daily.      GLUCOSAMINE-CHONDROITIN PO Take 1 tablet by mouth 2 (two) times a day.      metoprolol tartrate (LOPRESSOR) 25 MG tablet Take 12.5 mg by mouth 2 (two) times daily.     nitroGLYCERIN (NITROSTAT) 0.4 MG SL tablet Place 0.4 mg under the tongue as needed for chest pain.     warfarin (COUMADIN) 4 MG tablet TAKE 1 TABLET (4 MG TOTAL) BY MOUTH AS DIRECTED. 90 tablet 1   atorvastatin (LIPITOR) 10 MG tablet Take 1 tablet (10 mg total) by mouth daily. 30 tablet 11   aspirin EC 81 MG tablet Take 81 mg by mouth daily.     No facility-administered medications prior to visit.    PAST MEDICAL HISTORY: Past Medical History:  Diagnosis Date   A-fib [facility])    Coronary artery disease    Hypertension     PAST SURGICAL HISTORY: Past Surgical History:  Procedure Laterality Date   APPENDECTOMY     BACK SURGERY     CARDIAC CATHETERIZATION     CORONARY ANGIOPLASTY     FOOT SURGERY     INGUINAL HERNIA REPAIR     TONSILLECTOMY AND ADENOIDECTOMY  FAMILY HISTORY: Family History  Problem Relation Age of Onset   Heart disease Mother    Heart disease Father     SOCIAL HISTORY: Social History   Socioeconomic History   Marital status: Married    Spouse name: Not on file   Number of children: Not on file   Years of education: Not on file   Highest education level: Not on file  Occupational History   Not on file  Tobacco Use   Smoking status: Former   Smokeless tobacco: Never  Vaping Use   Vaping Use: Never used  Substance and Sexual Activity   Alcohol use: No   Drug use: No   Sexual activity: Not on file  Other Topics Concern   Not on file  Social History Narrative   Not on file   Social Determinants of Health   Financial  Resource Strain: Not on file  Food Insecurity: Not on file  Transportation Needs: Not on file  Physical Activity: Not on file  Stress: Not on file  Social Connections: Not on file  Intimate Partner Violence: Not on file    PHYSICAL EXAM  GENERAL EXAM/CONSTITUTIONAL: Vitals:  Vitals:   01/10/21 0942  Pulse: (!) 55  Weight: 181 lb (82.1 kg)  Height: 6\' 1"  (1.854 m)   Body mass index is 23.88 kg/m. Wt Readings from Last 3 Encounters:  01/10/21 181 lb (82.1 kg)  09/18/16 211 lb 6.4 oz (95.9 kg)   Patient is in no distress; well developed, nourished and groomed; neck is supple  CARDIOVASCULAR: Examination of carotid arteries is normal; no carotid bruits Regular rate and rhythm, no murmurs Examination of peripheral vascular system by observation and palpation is normal  EYES: Pupils round and reactive to light, Visual fields full to confrontation, Extraocular movements intacts,   MUSCULOSKELETAL: Gait, strength, tone, movements noted in Neurologic exam below  NEUROLOGIC: MENTAL STATUS:  MMSE - [place] Exam 01/10/2021  Orientation to time 1  Orientation to Place 2  Registration 3  Attention/ Calculation 0  Recall 0  Language- name 2 objects 2  Language- repeat 1  Language- follow 3 step command 3  Language- read & follow direction 1  Write a sentence 0  Copy design 1  Total score 14    CRANIAL NERVE:  2nd, 3rd, 4th, 6th - pupils equal and reactive to light, visual fields full to confrontation, extraocular muscles intact, no nystagmus 5th - facial sensation symmetric 7th - facial strength symmetric 8th - hearing intact 9th - palate elevates symmetrically, uvula midline 11th - shoulder shrug symmetric 12th - tongue protrusion midline  MOTOR:  normal bulk and tone, full strength in the BUE, BLE  SENSORY:  normal and symmetric to light touch, pinprick, temperature, vibration  COORDINATION:  finger-nose-finger, fine finger movements  normal  REFLEXES:  deep tendon reflexes present and symmetric  GAIT/STATION:  normal    DIAGNOSTIC DATA (LABS, IMAGING, TESTING) - I reviewed patient records, labs, notes, testing and imaging myself where available.  No results found for: WBC, HGB, HCT, MCV, PLT    Component Value Date/Time   PROT 6.5 10/22/2016 1504   ALBUMIN 4.3 10/22/2016 1504   AST 23 10/22/2016 1504   ALT 12 10/22/2016 1504   ALKPHOS 52 10/22/2016 1504   BILITOT 0.6 10/22/2016 1504   Lab Results  Component Value Date   CHOL 147 10/22/2016   HDL 37 (L) 10/22/2016   LDLCALC 74 10/22/2016   TRIG 182 (H) 10/22/2016  CHOLHDL 4.0 10/22/2016   No results found for: HGBA1C No results found for: VITAMINB12 No results found for: TSH  Head CT 04/2018 Atrophy with patchy supratentorial small vessel disease.  Focus of decreased attenuation in the gray-white junction in the inferior, anterior right frontal lobe, a finding that may represent a recent small infarct.  No other findings suggestive recent/ potentially acute infarct evident.  No mass or hemorrhage.   His last TSH and B12 was reported normal    ASSESSMENT AND PLAN  80 y.o. year old male with atrial fibrillation, hypertension, hyperlipidemia and coronary artery disease here for medical problem for the past 2 to 4 years.  Per patient his memory is fine, he does not have any issues but family has reported decline in memory for the past 3 to 4 years, he is forgetful, he repeat himself multiple times, he asks the same questions over and over, and wife also reported he gets very irritable when she asks a question or when he does not know the right answer.  He is currently on both Aricept and Namenda.  On today's exam his Mini-Mental status exam score is 14 out of 30 indicating a moderate impairment.  Based on history and exam patient likely has dementia, he is able to do all activities of daily living, bath himself, dress himself, feed himself, he still able  to drive to his golf course and there are no psychotic or abnormal behavioral features.  Patient likely has mild Alzheimer without behavioral disturbance.  I advised him to continue current medications, advised him to remain active with exercise daily if possible and to follow-up in 1 year.  All other questions answered.  Due to this increased irritability and period of confusion I will also obtain a routine EEG.  I will call the family to go over the result.    1. Mild Alzheimer's dementia without behavioral disturbance, psychotic disturbance, mood disturbance, or anxiety, unspecified timing of dementia onset [facility])      Patient Instructions  Continue current medications  Follow up with Dr Humphrey Rolls  Routine EEG  Return in 1 year      There are well-accepted and sensible ways to reduce risk for Alzheimers disease and other degenerative brain disorders .  Exercise Daily Walk A daily 20 minute walk should be part of your routine. Disease related apathy can be a significant roadblock to exercise and the only way to overcome this is to make it a daily routine and perhaps have a reward at the end (something your loved one loves to eat or drink perhaps) or a personal trainer coming to the home can also be very useful. Most importantly, the patient is much more likely to exercise if the caregiver / spouse does it with him/her. In general a structured, repetitive schedule is best.  General Health: Any diseases which effect your body will effect your brain such as a pneumonia, urinary infection, blood clot, heart attack or stroke. Keep contact with your primary care doctor for regular follow ups.  Sleep. A good nights sleep is healthy for the brain. Seven hours is recommended. If you have insomnia or poor sleep habits we can give you some instructions. If you have sleep apnea wear your mask.  Diet: Eating a heart healthy diet is also a good idea; fish and poultry instead of red meat, nuts (mostly  non-peanuts), vegetables, fruits, olive oil or canola oil (instead of butter), minimal salt (use other spices to flavor foods), whole grain rice,  bread, cereal and pasta and wine in moderation.Research is now showing that the MIND diet, which is a combination of The Mediterranean diet and the DASH diet, is beneficial for cognitive processing and longevity. Information about this diet can be found in The MIND Diet, a book by Doyne Keel, MS, RDN, and online at [employer]bookDist[employer].si  Finances, Power of Attorney and Advance Directives: You should consider putting legal safeguards in place with regard to financial and medical decision making. While the spouse always has power of attorney for medical and financial issues in the absence of any form, you should consider what you want in case the spouse / caregiver is no longer around or capable of making decisions.     Heart-head connection  New research shows there are things we can do to reduce the risk of mild cognitive impairment and dementia.  Several conditions known to increase the risk of cardiovascular disease -- such as high blood pressure, diabetes and high cholesterol -- also increase the risk of developing Alzheimer's. Some autopsy studies show that as many as 34 percent of individuals with Alzheimer's disease also have cardiovascular disease.  A longstanding question is why some people develop hallmark Alzheimer's plaques and tangles but do not develop the symptoms of Alzheimer's. Vascular disease may help researchers eventually find an answer. Some autopsy studies suggest that plaques and tangles may be present in the brain without causing symptoms of cognitive decline unless the brain also shows evidence of vascular disease. More research is needed to better understand the link between vascular health and Alzheimer's.  Physical exercise and diet Regular physical exercise may be a beneficial strategy to lower the risk  of Alzheimer's and vascular dementia. Exercise may directly benefit brain cells by increasing blood and oxygen flow in the brain. Because of its known cardiovascular benefits, a medically approved exercise program is a valuable part of any overall wellness plan.  Current evidence suggests that heart-healthy eating may also help protect the brain. Heart-healthy eating includes limiting the intake of sugar and saturated fats and making sure to eat plenty of fruits, vegetables, and whole grains. No one diet is best. Two diets that have been studied and may be beneficial are the DASH (Dietary Approaches to Stop Hypertension) diet and the Mediterranean diet. The DASH diet emphasizes vegetables, fruits and fat-free or low-fat dairy products; includes whole grains, fish, poultry, beans, seeds, nuts and vegetable oils; and limits sodium, sweets, sugary beverages and red meats. A Mediterranean diet includes relatively little red meat and emphasizes whole grains, fruits and vegetables, fish and shellfish, and nuts, olive oil and other healthy fats.  Social connections and intellectual activity A number of studies indicate that maintaining strong social connections and keeping mentally active as we age might lower the risk of cognitive decline and Alzheimer's. Experts are not certain about the reason for this association. It may be due to direct mechanisms through which social and mental stimulation strengthen connections between nerve cells in the brain.  Head trauma There appears to be a strong link between future risk of Alzheimer's and serious head trauma, especially when injury involves loss of consciousness. You can help reduce your risk of Alzheimer's by protecting your head.  Wear a seat belt  Use a helmet when participating in sports  "Fall-proof" your home   What you can do now While research is not yet conclusive, certain lifestyle choices, such as physical activity and diet, may help support brain  health and prevent Alzheimer's. Many of these  lifestyle changes have been shown to lower the risk of other diseases, like heart disease and diabetes, which have been linked to Alzheimer's. With few drawbacks and plenty of known benefits, healthy lifestyle choices can improve your health and possibly protect your brain.  Learn more about brain health. You can help increase our knowledge by considering participation in a clinical study. Our free clinical trial matching services, TrialMatch, can help you find clinical trials in your area that are seeking volunteers.     Orders Placed This Encounter  Procedures   EEG adult    No orders of the defined types were placed in this encounter.   Return in about 1 year (around 01/10/2022).    Alric Ran, MD 01/11/2021, 8:40 AM  [facility] [address], [place], [place] [address] [phone]

## 2021-01-16 ENCOUNTER — Ambulatory Visit (INDEPENDENT_AMBULATORY_CARE_PROVIDER_SITE_OTHER): Payer: Medicare Other | Admitting: Neurology

## 2021-01-16 DIAGNOSIS — R410 Disorientation, unspecified: Secondary | ICD-10-CM | POA: Diagnosis not present

## 2021-01-16 DIAGNOSIS — F02A0 Dementia in other diseases classified elsewhere, mild, without behavioral disturbance, psychotic disturbance, mood disturbance, and anxiety: Secondary | ICD-10-CM

## 2021-01-16 DIAGNOSIS — G309 Alzheimer's disease, unspecified: Secondary | ICD-10-CM

## 2021-01-16 NOTE — Procedures (Signed)
° ° °  History:  15 year with dementia and increase irritability and confusion   EEG classification: Awake and drowsy  Description of the recording: The background rhythms of this recording consists of a fairly well modulated medium amplitude alpha rhythm of 6-7 Hz at best that is reactive to eye opening and closure. As the record progresses, the patient appears to remain in the waking state throughout the recording. Photic stimulation was performed, did not show any abnormalities. Hyperventilation was not performed. Toward the end of the recording, the patient enters the drowsy state with slight symmetric slowing seen. The patient never enters stage II sleep. No abnormal epileptiform discharges seen during this recording. There was mild diffuse focal. EKG monitor shows a irregular cardiac rhythm with a heart rate of 60. Patient has a history of atrial fibrillation.   Impression: This is an abnormal EEG recording in the waking and drowsy state due to mild diffuse slowing. No evidence of interictal epileptiform discharges seen. Diffuse slowing is consistent with a generalized brain dysfunction such as in encephalopathy. This is also seen in patient with dementia.    Alric Ran, MD [facility]

## 2021-01-18 ENCOUNTER — Other Ambulatory Visit: Payer: Self-pay

## 2021-01-18 ENCOUNTER — Encounter: Payer: Self-pay | Admitting: Cardiology

## 2021-01-18 ENCOUNTER — Ambulatory Visit (INDEPENDENT_AMBULATORY_CARE_PROVIDER_SITE_OTHER): Payer: Medicare Other | Admitting: Cardiology

## 2021-01-18 VITALS — BP 136/76 | HR 67 | Ht 72.0 in | Wt 184.4 lb

## 2021-01-18 DIAGNOSIS — I25118 Atherosclerotic heart disease of native coronary artery with other forms of angina pectoris: Secondary | ICD-10-CM

## 2021-01-18 DIAGNOSIS — I482 Chronic atrial fibrillation, unspecified: Secondary | ICD-10-CM | POA: Diagnosis not present

## 2021-01-18 DIAGNOSIS — R001 Bradycardia, unspecified: Secondary | ICD-10-CM | POA: Diagnosis not present

## 2021-01-18 DIAGNOSIS — E782 Mixed hyperlipidemia: Secondary | ICD-10-CM | POA: Diagnosis not present

## 2021-01-18 NOTE — Progress Notes (Signed)
Called and spoke with patient, informed him that his EEG did not show any seizures, no epileptiform discharges.  There was mild diffuse slowing that is consistent with his diagnosis of dementia.  No additional test indicated at the moment.  Follow-up as scheduled

## 2021-01-18 NOTE — Progress Notes (Signed)
Cardiology Consultation:    Date:  01/18/2021   ID:  [name], DOB 27-Nov-1940, MRN [id number]  PCP:  Mateo Flow, MD  Cardiologist:  Jenne Campus, MD   Referring MD: Mateo Flow, MD   Chief Complaint  Patient presents with   Re eval    Last seen in 2018  Doing fine  History of Present Illness:    Nicholas Pratt is a 81 y.o. male who is being seen today for the evaluation of coronary artery disease as well as permanent atrial fibrillation at the request of Mateo Flow, MD. past medical history significant for permanent atrial fibrillation, coronary artery disease, in 2017 he did have a cardiac catheterization which showed completely occluded proximal right coronary artery no disease elsewhere.  Medical therapy has been instituted at that time.  Also essential hypertension.  Dyslipidemia. He comes to me back after not being seen for couple years.  He is doing quite well for somebody who is 81 years old he is doing well he still try to play golf he goes and socialize with his friends he is trying to walk he said walking uphill will give him shortness of breath but otherwise no chest pain tightness squeezing pressure burning chest.  Past Medical History:  Diagnosis Date   A-fib ([place])    Coronary artery disease    Hypertension     Past Surgical History:  Procedure Laterality Date   APPENDECTOMY     BACK SURGERY     CARDIAC CATHETERIZATION     CORONARY ANGIOPLASTY     FOOT SURGERY     INGUINAL HERNIA REPAIR     TONSILLECTOMY AND ADENOIDECTOMY      Current Medications: Current Meds  Medication Sig   atorvastatin (LIPITOR) 10 MG tablet Take 1 tablet (10 mg total) by mouth daily.   donepezil (ARICEPT) 5 MG tablet Take 10 mg by mouth daily.    Magnesium 400 MG TABS Take 1 tablet by mouth daily.   metoprolol tartrate (LOPRESSOR) 25 MG tablet Take 12.5 mg by mouth 2 (two) times daily.   nitroGLYCERIN (NITROSTAT) 0.4 MG SL tablet Place 0.4 mg under the tongue as  needed for chest pain.   warfarin (COUMADIN) 4 MG tablet TAKE 1 TABLET (4 MG TOTAL) BY MOUTH AS DIRECTED.   [DISCONTINUED] GLUCOSAMINE-CHONDROITIN PO Take 1 tablet by mouth 2 (two) times a day.      Allergies:   Oxycodone   Social History   Socioeconomic History   Marital status: Married    Spouse name: Not on file   Number of children: Not on file   Years of education: Not on file   Highest education level: Not on file  Occupational History   Not on file  Tobacco Use   Smoking status: Former   Smokeless tobacco: Never  Vaping Use   Vaping Use: Never used  Substance and Sexual Activity   Alcohol use: No   Drug use: No   Sexual activity: Not on file  Other Topics Concern   Not on file  Social History Narrative   Not on file   Social Determinants of Health   Financial Resource Strain: Not on file  Food Insecurity: Not on file  Transportation Needs: Not on file  Physical Activity: Not on file  Stress: Not on file  Social Connections: Not on file     Family History: The patient's family history includes Heart disease in his father and mother. ROS:  Please see the history of present illness.    All 14 point review of systems negative except as described per history of present illness.  EKGs/Labs/Other Studies Reviewed:    The following studies were reviewed today:   EKG:  EKG is  ordered today.  The ekg ordered today demonstrates atrial fibrillation, left bundle branch block which was not present on last echocardiogram.  Recent Labs: No results found for requested labs within last 8760 hours.  Recent Lipid Panel    Component Value Date/Time   CHOL 147 10/22/2016 1459   TRIG 182 (H) 10/22/2016 1459   HDL 37 (L) 10/22/2016 1459   CHOLHDL 4.0 10/22/2016 1459   LDLCALC 74 10/22/2016 1459    Physical Exam:    VS:  BP 136/76 (BP Location: Right Arm, Patient Position: Sitting)    Pulse 67    Ht 6' (1.829 m)    Wt 184 lb 6.4 oz (83.6 kg)    SpO2 97%    BMI  25.01 kg/m     Wt Readings from Last 3 Encounters:  01/18/21 184 lb 6.4 oz (83.6 kg)  01/10/21 181 lb (82.1 kg)  09/18/16 211 lb 6.4 oz (95.9 kg)     GEN:  Well nourished, well developed in no acute distress HEENT: Normal NECK: No JVD; No carotid bruits LYMPHATICS: No lymphadenopathy CARDIAC: Irregularly irregular, no murmurs, no rubs, no gallops RESPIRATORY:  Clear to auscultation without rales, wheezing or rhonchi  ABDOMEN: Soft, non-tender, non-distended MUSCULOSKELETAL:  No edema; No deformity  SKIN: Warm and dry NEUROLOGIC:  Alert and oriented x 3 PSYCHIATRIC:  Normal affect   ASSESSMENT:    1. Chronic atrial fibrillation ([place])   2. Coronary artery disease of native artery of native heart with stable angina pectoris ([place])   3. Bradycardia   4. Mixed dyslipidemia    PLAN:    In order of problems listed above:  Permanent atrial fibrillation: Rate controlled continue present medications.  He is anticoagulated Coumadin I had a long discussion with him as well as with his wife 20 figure out why he is not on a newer agent.  He is willing to try.  He does not have mechanical valve therefore I think it would be reasonable to switch him to Eliquis 5 mg twice daily.  I will check his Chem-7 today I will also check his PT/INR and based on that decide how we will switch him to Eliquis. History of coronary artery disease with known completely occluded right coronary artery disease.  Asymptomatic on appropriate medication continue Bradycardia denies have any dizziness or passing out however he is EKG showed left bundle branch block.  Echocardiogram will be done. Mixed dyslipidemia I will check his fasting lipid profile today.  I do see his blood test from 2018.   Medication Adjustments/Labs and Tests Ordered: Current medicines are reviewed at length with the patient today.  Concerns regarding medicines are outlined above.  No orders of the defined types were placed in this  encounter.  No orders of the defined types were placed in this encounter.   Signed, Park Liter, MD, [facility]. 01/18/2021 3:27 PM    [place]

## 2021-01-18 NOTE — Patient Instructions (Signed)
Medication Instructions:  Your physician recommends that you continue on your current medications as directed. Please refer to the Current Medication list given to you today.  *If you need a refill on your cardiac medications before your next appointment, please call your pharmacy*   Lab Work: Your physician recommends that you return for lab work in: Labs today: INR,Direct LDL, BMP If you have labs (blood work) drawn today and your tests are completely normal, you will receive your results only by: [place] (if you have MyChart) OR A paper copy in the mail If you have any lab test that is abnormal or we need to change your treatment, we will call you to review the results.   Testing/Procedures: Your physician has requested that you have an echocardiogram. Echocardiography is a painless test that uses sound waves to create images of your heart. It provides your doctor with information about the size and shape of your heart and how well your hearts chambers and valves are working. This procedure takes approximately one hour. There are no restrictions for this procedure.    Follow-Up: At [facility], you and your health needs are our priority.  As part of our continuing mission to provide you with exceptional heart care, we have created designated Provider Care Teams.  These Care Teams include your primary Cardiologist (physician) and Advanced Practice Providers (APPs -  Physician Assistants and Nurse Practitioners) who all work together to provide you with the care you need, when you need it.  We recommend signing up for the patient portal called "MyChart".  Sign up information is provided on this After Visit Summary.  MyChart is used to connect with patients for Virtual Visits (Telemedicine).  Patients are able to view lab/test results, encounter notes, upcoming appointments, etc.  Non-urgent messages can be sent to your provider as well.   To learn more about what you can do with  MyChart, go to [email or web address].    Your next appointment:   5 month(s)  The format for your next appointment:   In Person  Provider:   Jenne Campus, MD    Other Instructions Echocardiogram An echocardiogram is a test that uses sound waves (ultrasound) to produce images of the heart. Images from an echocardiogram can provide important information about: Heart size and shape. The size and thickness and movement of your heart's walls. Heart muscle function and strength. Heart valve function or if you have stenosis. Stenosis is when the heart valves are too narrow. If blood is flowing backward through the heart valves (regurgitation). A tumor or infectious growth around the heart valves. Areas of heart muscle that are not working well because of poor blood flow or injury from a heart attack. Aneurysm detection. An aneurysm is a weak or damaged part of an artery wall. The wall bulges out from the normal force of blood pumping through the body. Tell a health care provider about: Any allergies you have. All medicines you are taking, including vitamins, herbs, eye drops, creams, and over-the-counter medicines. Any blood disorders you have. Any surgeries you have had. Any medical conditions you have. Whether you are pregnant or may be pregnant. What are the risks? Generally, this is a safe test. However, problems may occur, including an allergic reaction to dye (contrast) that may be used during the test. What happens before the test? No specific preparation is needed. You may eat and drink normally. What happens during the test?  You will take off your clothes from the waist  up and put on a hospital gown. Electrodes or electrocardiogram (ECG)patches may be placed on your chest. The electrodes or patches are then connected to a device that monitors your heart rate and rhythm. You will lie down on a table for an ultrasound exam. A gel will be applied to your chest to help  sound waves pass through your skin. A handheld device, called a transducer, will be pressed against your chest and moved over your heart. The transducer produces sound waves that travel to your heart and bounce back (or "echo" back) to the transducer. These sound waves will be captured in real-time and changed into images of your heart that can be viewed on a video monitor. The images will be recorded on a computer and reviewed by your health care provider. You may be asked to change positions or hold your breath for a short time. This makes it easier to get different views or better views of your heart. In some cases, you may receive contrast through an IV in one of your veins. This can improve the quality of the pictures from your heart. The procedure may vary among health care providers and hospitals. What can I expect after the test? You may return to your normal, everyday life, including diet, activities, and medicines, unless your health care provider tells you not to do that. Follow these instructions at home: It is up to you to get the results of your test. Ask your health care provider, or the department that is doing the test, when your results will be ready. Keep all follow-up visits. This is important. Summary An echocardiogram is a test that uses sound waves (ultrasound) to produce images of the heart. Images from an echocardiogram can provide important information about the size and shape of your heart, heart muscle function, heart valve function, and other possible heart problems. You do not need to do anything to prepare before this test. You may eat and drink normally. After the echocardiogram is completed, you may return to your normal, everyday life, unless your health care provider tells you not to do that. This information is not intended to replace advice given to you by your health care provider. Make sure you discuss any questions you have with your health care  provider. Document Revised: 09/12/2020 Document Reviewed: 08/23/2019 Elsevier Patient Education  2022 [employer].

## 2021-01-19 LAB — BASIC METABOLIC PANEL
BUN/Creatinine Ratio: 17 (ref 10–24)
BUN: 21 mg/dL (ref 8–27)
CO2: 26 mmol/L (ref 20–29)
Calcium: 9.4 mg/dL (ref 8.6–10.2)
Chloride: 104 mmol/L (ref 96–106)
Creatinine, Ser: 1.26 mg/dL (ref 0.76–1.27)
Glucose: 90 mg/dL (ref 70–99)
Potassium: 4.6 mmol/L (ref 3.5–5.2)
Sodium: 141 mmol/L (ref 134–144)
eGFR: 58 mL/min/{1.73_m2} — ABNORMAL LOW (ref >59)

## 2021-01-19 LAB — LDL CHOLESTEROL, DIRECT: LDL Direct: 83 mg/dL (ref 0–99)

## 2021-01-19 LAB — PROTIME-INR
INR: 3.6 — ABNORMAL HIGH (ref 0.9–1.2)
Prothrombin Time: 35.3 s — ABNORMAL HIGH (ref 9.1–12.0)

## 2021-01-22 ENCOUNTER — Telehealth: Payer: Self-pay

## 2021-01-22 NOTE — Telephone Encounter (Signed)
-----   Message from Nicholas Liter, MD sent at 01/22/2021 12:57 PM EST ----- Ask him to stop Coumadin and start with Eliquis 5 mg twice daily in 4 days

## 2021-01-22 NOTE — Telephone Encounter (Signed)
Left message on patients voicemail to please return our call.   

## 2021-01-23 NOTE — Addendum Note (Signed)
Addended by: Ajene Carchi on: 11/13/2021 05:01 PM   Modules accepted: Orders  

## 2021-01-25 ENCOUNTER — Other Ambulatory Visit: Payer: Self-pay

## 2021-01-25 ENCOUNTER — Ambulatory Visit (INDEPENDENT_AMBULATORY_CARE_PROVIDER_SITE_OTHER): Payer: Medicare Other

## 2021-01-25 DIAGNOSIS — R001 Bradycardia, unspecified: Secondary | ICD-10-CM

## 2021-01-25 DIAGNOSIS — I25118 Atherosclerotic heart disease of native coronary artery with other forms of angina pectoris: Secondary | ICD-10-CM | POA: Diagnosis not present

## 2021-01-25 DIAGNOSIS — I482 Chronic atrial fibrillation, unspecified: Secondary | ICD-10-CM | POA: Diagnosis not present

## 2021-01-25 DIAGNOSIS — E782 Mixed hyperlipidemia: Secondary | ICD-10-CM

## 2021-01-25 LAB — ECHOCARDIOGRAM COMPLETE
Area-P 1/2: 3.87 cm2
Calc EF: 38.9 %
MV M vel: 4.93 m/s
MV Peak grad: 97.2 mmHg
Radius: 0.5 cm
S' Lateral: 3.7 cm
Single Plane A2C EF: 47.9 %
Single Plane A4C EF: 32.8 %

## 2021-01-29 ENCOUNTER — Telehealth: Payer: Self-pay

## 2021-01-29 ENCOUNTER — Telehealth: Payer: Self-pay | Admitting: Neurology

## 2021-01-29 MED ORDER — APIXABAN 5 MG PO TABS: 5.0000 mg | ORAL_TABLET | Freq: Two times a day (BID) | ORAL | 0 refills | Status: DC

## 2021-01-29 NOTE — Telephone Encounter (Signed)
Spoke with wife, informed her of EEG results showing mild generalized slowing. Informed her that I had call on 1/6 and spoke with patient directly but he does not remember our conversation.

## 2021-01-29 NOTE — Telephone Encounter (Signed)
Pt's wife, Ollivander See (on [place]) have not heard from anyone about EEG results. Would like a call from the nurse.

## 2021-01-29 NOTE — Telephone Encounter (Signed)
Prescription refill request for Eliquis received. Indication: Afib  Last office visit:01/18/21 [name])  Scr: 1.26 (01/18/21)  Age: 81 Weight: 83.6kg  Pt switched from Warfarin to Eliquis per Dr Wendy Poet note on 01/22/21. Pt was instructed to stop Warfarin and begin taking Eliquis in 4 days. Called pt and spoke with pt's wife to confirm pt has now started taking Eliquis. Will resolve anticoagulation episode.

## 2021-01-31 ENCOUNTER — Other Ambulatory Visit: Payer: Self-pay | Admitting: *Deleted

## 2021-01-31 ENCOUNTER — Telehealth: Payer: Self-pay | Admitting: Cardiology

## 2021-01-31 DIAGNOSIS — I25118 Atherosclerotic heart disease of native coronary artery with other forms of angina pectoris: Secondary | ICD-10-CM

## 2021-01-31 DIAGNOSIS — I482 Chronic atrial fibrillation, unspecified: Secondary | ICD-10-CM

## 2021-01-31 MED ORDER — ENTRESTO 24-26 MG PO TABS
1.0000 | ORAL_TABLET | Freq: Two times a day (BID) | ORAL | 11 refills | Status: DC
Start: 2021-01-31 — End: 2021-02-27

## 2021-01-31 NOTE — Telephone Encounter (Signed)
Patient's wife called and mentioned that patient was put on Entresto and the medication is $500/wk. Says that they cannot afford it and would like to know if the was another medication they could try. Please call back to discuss

## 2021-02-01 NOTE — Telephone Encounter (Signed)
Called patient to get more information. Patient stated that "the entresto was very expensive but his wife usually took care of all of that stuff". He requested that I call back Monday when his wife would be available to discuss it with me. I will call him back on Monday

## 2021-02-04 NOTE — Telephone Encounter (Signed)
Patient's wife Nicholas Pratt came into the office stating her husband had dementia and would like to change the main contact number to hers.  Nicholas Pratt was on the patient's DPR so we updated the phone number and informed her we have no samples of Entresto but gave her the copay card and the patient assistance application for [facility] as well as Eliquis.  She thanked me for the help and will return the paperwork into the office.

## 2021-02-04 NOTE — Telephone Encounter (Signed)
Called patient to get more information. Patient did not answer the phone. Left message for patient to call back.

## 2021-02-13 ENCOUNTER — Telehealth: Payer: Self-pay

## 2021-02-13 NOTE — Telephone Encounter (Signed)
Spoke to the patient, aware we're needing actual stubs or tax return for proof of income to assist with patient assistance forms for [employer] and [employer]

## 2021-02-19 DIAGNOSIS — L989 Disorder of the skin and subcutaneous tissue, unspecified: Secondary | ICD-10-CM | POA: Diagnosis not present

## 2021-02-19 DIAGNOSIS — F03A0 Unspecified dementia, mild, without behavioral disturbance, psychotic disturbance, mood disturbance, and anxiety: Secondary | ICD-10-CM | POA: Diagnosis not present

## 2021-02-19 DIAGNOSIS — Z79899 Other long term (current) drug therapy: Secondary | ICD-10-CM | POA: Diagnosis not present

## 2021-02-19 DIAGNOSIS — M79605 Pain in left leg: Secondary | ICD-10-CM | POA: Diagnosis not present

## 2021-02-21 DIAGNOSIS — Z20822 Contact with and (suspected) exposure to covid-19: Secondary | ICD-10-CM | POA: Diagnosis not present

## 2021-02-22 DIAGNOSIS — Z20822 Contact with and (suspected) exposure to covid-19: Secondary | ICD-10-CM | POA: Diagnosis not present

## 2021-02-22 NOTE — Telephone Encounter (Signed)
Spoke with the patient wife Rita(on DPR) she said she is resending all info including the application and has attach proof of income.

## 2021-02-25 DIAGNOSIS — L570 Actinic keratosis: Secondary | ICD-10-CM | POA: Diagnosis not present

## 2021-02-25 DIAGNOSIS — D485 Neoplasm of uncertain behavior of skin: Secondary | ICD-10-CM | POA: Diagnosis not present

## 2021-02-25 DIAGNOSIS — D225 Melanocytic nevi of trunk: Secondary | ICD-10-CM | POA: Diagnosis not present

## 2021-02-25 DIAGNOSIS — C44629 Squamous cell carcinoma of skin of left upper limb, including shoulder: Secondary | ICD-10-CM | POA: Diagnosis not present

## 2021-02-25 DIAGNOSIS — D2239 Melanocytic nevi of other parts of face: Secondary | ICD-10-CM | POA: Diagnosis not present

## 2021-02-27 ENCOUNTER — Other Ambulatory Visit: Payer: Self-pay

## 2021-02-27 MED ORDER — ENTRESTO 24-26 MG PO TABS: 1.0000 | ORAL_TABLET | Freq: Two times a day (BID) | ORAL | 11 refills | Status: DC

## 2021-03-04 ENCOUNTER — Telehealth: Payer: Self-pay

## 2021-03-04 NOTE — Telephone Encounter (Signed)
Patient's eliquis patient assistance paperwork faxed to [employer].

## 2021-03-06 ENCOUNTER — Telehealth: Payer: Self-pay

## 2021-03-06 NOTE — Telephone Encounter (Signed)
Patient's entresto patient assistance information faxed over to [employer]

## 2021-03-08 DIAGNOSIS — C44629 Squamous cell carcinoma of skin of left upper limb, including shoulder: Secondary | ICD-10-CM | POA: Diagnosis not present

## 2021-03-12 ENCOUNTER — Telehealth: Payer: Self-pay

## 2021-03-12 NOTE — Telephone Encounter (Signed)
Patient aware application was denied due to income requirements.

## 2021-03-20 DIAGNOSIS — M545 Low back pain, unspecified: Secondary | ICD-10-CM | POA: Diagnosis not present

## 2021-04-27 DIAGNOSIS — Z20822 Contact with and (suspected) exposure to covid-19: Secondary | ICD-10-CM | POA: Diagnosis not present

## 2021-04-30 DIAGNOSIS — M549 Dorsalgia, unspecified: Secondary | ICD-10-CM | POA: Diagnosis not present

## 2021-05-02 DIAGNOSIS — M545 Low back pain, unspecified: Secondary | ICD-10-CM | POA: Insufficient documentation

## 2021-05-21 ENCOUNTER — Other Ambulatory Visit: Payer: Self-pay | Admitting: Cardiology

## 2021-05-21 DIAGNOSIS — I482 Chronic atrial fibrillation, unspecified: Secondary | ICD-10-CM

## 2021-05-21 NOTE — Telephone Encounter (Signed)
Prescription refill request for Eliquis received. ?Indication: Afib  ?Last office visit:01/18/21 [name])  ?Scr:1.26 (01/18/21)  ?Age: 81 ?Weight: 83.6kg ? ?Appropriate dose and refill sent to requested pharmacy.  ?

## 2021-05-27 DIAGNOSIS — C44629 Squamous cell carcinoma of skin of left upper limb, including shoulder: Secondary | ICD-10-CM | POA: Diagnosis not present

## 2021-05-27 DIAGNOSIS — L570 Actinic keratosis: Secondary | ICD-10-CM | POA: Diagnosis not present

## 2021-06-12 DIAGNOSIS — J22 Unspecified acute lower respiratory infection: Secondary | ICD-10-CM | POA: Diagnosis not present

## 2021-06-12 DIAGNOSIS — F03A0 Unspecified dementia, mild, without behavioral disturbance, psychotic disturbance, mood disturbance, and anxiety: Secondary | ICD-10-CM | POA: Diagnosis not present

## 2021-06-27 ENCOUNTER — Ambulatory Visit: Payer: Medicare Other | Admitting: Cardiology

## 2021-07-18 DIAGNOSIS — M25471 Effusion, right ankle: Secondary | ICD-10-CM | POA: Diagnosis not present

## 2021-07-18 DIAGNOSIS — Z7901 Long term (current) use of anticoagulants: Secondary | ICD-10-CM | POA: Diagnosis not present

## 2021-07-18 DIAGNOSIS — Z6824 Body mass index (BMI) 24.0-24.9, adult: Secondary | ICD-10-CM | POA: Diagnosis not present

## 2021-07-18 DIAGNOSIS — M25472 Effusion, left ankle: Secondary | ICD-10-CM | POA: Diagnosis not present

## 2021-07-18 DIAGNOSIS — F03A0 Unspecified dementia, mild, without behavioral disturbance, psychotic disturbance, mood disturbance, and anxiety: Secondary | ICD-10-CM | POA: Diagnosis not present

## 2021-09-03 ENCOUNTER — Ambulatory Visit (INDEPENDENT_AMBULATORY_CARE_PROVIDER_SITE_OTHER): Payer: Medicare Other | Admitting: Cardiology

## 2021-09-03 ENCOUNTER — Encounter: Payer: Self-pay | Admitting: Cardiology

## 2021-09-03 VITALS — BP 98/56 | HR 60 | Ht 71.0 in | Wt 176.4 lb

## 2021-09-03 DIAGNOSIS — I482 Chronic atrial fibrillation, unspecified: Secondary | ICD-10-CM

## 2021-09-03 DIAGNOSIS — R0609 Other forms of dyspnea: Secondary | ICD-10-CM | POA: Diagnosis not present

## 2021-09-03 DIAGNOSIS — E782 Mixed hyperlipidemia: Secondary | ICD-10-CM

## 2021-09-03 DIAGNOSIS — I25118 Atherosclerotic heart disease of native coronary artery with other forms of angina pectoris: Secondary | ICD-10-CM

## 2021-09-03 DIAGNOSIS — R001 Bradycardia, unspecified: Secondary | ICD-10-CM

## 2021-09-03 DIAGNOSIS — I10 Essential (primary) hypertension: Secondary | ICD-10-CM | POA: Diagnosis not present

## 2021-09-03 NOTE — Patient Instructions (Addendum)
Medication Instructions:  Your physician recommends that you continue on your current medications as directed. Please refer to the Current Medication list given to you today.  *If you need a refill on your cardiac medications before your next appointment, please call your pharmacy*   Lab Work: BMP, ProBNP- today If you have labs (blood work) drawn today and your tests are completely normal, you will receive your results only by: MyChart Message (if you have MyChart) OR A paper copy in the mail If you have any lab test that is abnormal or we need to change your treatment, we will call you to review the results.   Testing/Procedures: None Ordered   Follow-Up: At [facility], you and your health needs are our priority.  As part of our continuing mission to provide you with exceptional heart care, we have created designated Provider Care Teams.  These Care Teams include your primary Cardiologist (physician) and Advanced Practice Providers (APPs -  Physician Assistants and Nurse Practitioners) who all work together to provide you with the care you need, when you need it.  We recommend signing up for the patient portal called "MyChart".  Sign up information is provided on this After Visit Summary.  MyChart is used to connect with patients for Virtual Visits (Telemedicine).  Patients are able to view lab/test results, encounter notes, upcoming appointments, etc.  Non-urgent messages can be sent to your provider as well.   To learn more about what you can do with MyChart, go to [email or web address].    Your next appointment:   6 month(s)  The format for your next appointment:   In Person  Provider:   Robert Krasowski, MD    Other Instructions NA  

## 2021-09-03 NOTE — Progress Notes (Unsigned)
Cardiology Office Note:    Date:  09/03/2021   ID:  Nicholas Pratt, DOB 31-Oct-1940, MRN [id number]  PCP:  Mateo Flow, MD  Cardiologist:  Jenne Campus, MD    Referring MD: Mateo Flow, MD   Chief Complaint  Patient presents with   Joint Swelling     R ankle Ongoing for 2-3 weeks, now seem to have resolve.     History of Present Illness:    Nicholas Pratt is a 81 y.o. male with past medical history significant for permanent atrial fibrillation, coronary artery disease, in 2017 he had cardiac catheterization which showed completely occluded proximal portion of the right coronary artery with no significant disease elsewhere.  Additional issues include essential hypertension, dyslipidemia.  Also he did have echocardiogram done at the beginning of the year showed he was found to have cardiomyopathy with ejection fraction 40%, however, we have difficulty putting him on medications because of low blood pressure.  He comes to my office today with his wife.  The issue is swelling of lower EXTR that happened couple weeks ago for few days at evening time.  Since that time things are fine and he denies having any swelling of lower extremities.  Additional issue is his memory which is gradually getting worse.  He was put on Aricept but he keeps forgetting things and he want to drive he was told not to.  Past Medical History:  Diagnosis Date   A-fib [facility])    Coronary artery disease    Hypertension     Past Surgical History:  Procedure Laterality Date   APPENDECTOMY     BACK SURGERY     CARDIAC CATHETERIZATION     CORONARY ANGIOPLASTY     FOOT SURGERY     INGUINAL HERNIA REPAIR     TONSILLECTOMY AND ADENOIDECTOMY      Current Medications: Current Meds  Medication Sig   apixaban (ELIQUIS) 5 MG TABS tablet TAKE 1 TABLET BY MOUTH TWICE A DAY (Patient taking differently: Take 5 mg by mouth 2 (two) times daily.)   atorvastatin (LIPITOR) 10 MG tablet Take 1 tablet (10 mg total) by  mouth daily.   donepezil (ARICEPT) 5 MG tablet Take 10 mg by mouth daily.    Magnesium 400 MG TABS Take 1 tablet by mouth daily.   metoprolol tartrate (LOPRESSOR) 25 MG tablet Take 12.5 mg by mouth 2 (two) times daily.   nitroGLYCERIN (NITROSTAT) 0.4 MG SL tablet Place 0.4 mg under the tongue as needed for chest pain.   sacubitril-valsartan (ENTRESTO) 24-26 MG Take 1 tablet by mouth 2 (two) times daily.     Allergies:   Oxycodone   Social History   Socioeconomic History   Marital status: Married    Spouse name: Not on file   Number of children: Not on file   Years of education: Not on file   Highest education level: Not on file  Occupational History   Not on file  Tobacco Use   Smoking status: Former   Smokeless tobacco: Never  Vaping Use   Vaping Use: Never used  Substance and Sexual Activity   Alcohol use: No   Drug use: No   Sexual activity: Not on file  Other Topics Concern   Not on file  Social History Narrative   Not on file   Social Determinants of Health   Financial Resource Strain: Not on file  Food Insecurity: Not on file  Transportation Needs: Not on file  Physical Activity: Not on file  Stress: Not on file  Social Connections: Not on file     Family History: The patient's family history includes Heart disease in his father and mother. ROS:   Please see the history of present illness.    All 14 point review of systems negative except as described per history of present illness  EKGs/Labs/Other Studies Reviewed:      Recent Labs: 01/18/2021: BUN 21; Creatinine, Ser 1.26; Potassium 4.6; Sodium 141  Recent Lipid Panel    Component Value Date/Time   CHOL 147 10/22/2016 1459   TRIG 182 (H) 10/22/2016 1459   HDL 37 (L) 10/22/2016 1459   CHOLHDL 4.0 10/22/2016 1459   LDLCALC 74 10/22/2016 1459   LDLDIRECT 83 01/18/2021 1550    Physical Exam:    VS:  BP (!) 98/56 (BP Location: Left Arm, Patient Position: Sitting)   Pulse 60   Ht '5\' 11"'$  (1.803 m)    Wt 176 lb 6.4 oz (80 kg)   SpO2 95%   BMI 24.60 kg/m     Wt Readings from Last 3 Encounters:  09/03/21 176 lb 6.4 oz (80 kg)  01/18/21 184 lb 6.4 oz (83.6 kg)  01/10/21 181 lb (82.1 kg)     GEN:  Well nourished, well developed in no acute distress HEENT: Normal NECK: No JVD; No carotid bruits LYMPHATICS: No lymphadenopathy CARDIAC: Irregularly irregular, no murmurs, no rubs, no gallops RESPIRATORY:  Clear to auscultation without rales, wheezing or rhonchi  ABDOMEN: Soft, non-tender, non-distended MUSCULOSKELETAL:  No edema; No deformity  SKIN: Warm and dry LOWER EXTREMITIES: no swelling NEUROLOGIC:  Alert and oriented x 3 PSYCHIATRIC:  Normal affect   ASSESSMENT:    1. Chronic atrial fibrillation ([place])   2. Coronary artery disease of native artery of native heart with stable angina pectoris ([place])   3. Essential hypertension   4. Bradycardia   5. Mixed dyslipidemia    PLAN:    In order of problems listed above:  Permanent atrial fibrillation, he is on Eliquis which I will continue Cardiomyopathy with ejection fraction 40% however have no room in terms of blood pressure to put him on any medications.  He seems to be hemodynamically compensated right now.  We will continue only with metoprolol titrate only 12.5 twice daily.  I will ask him to have proBNP and Chem-7 today Essential hypertension, with dealing with opposite problem blood pressure being low Blood cardiac denies having dizziness or passing out. Mixed dyslipidemia, I did review K PN which show me his HDL of 37 LDL 83.  He is on Lipitor 10 I will increase to 20   Medication Adjustments/Labs and Tests Ordered: Current medicines are reviewed at length with the patient today.  Concerns regarding medicines are outlined above.  No orders of the defined types were placed in this encounter.  Medication changes: No orders of the defined types were placed in this encounter.   Signed, Park Liter, MD,  [facility] 09/03/2021 4:03 PM    [facility]

## 2021-09-04 LAB — BASIC METABOLIC PANEL
BUN/Creatinine Ratio: 22 (ref 10–24)
BUN: 30 mg/dL — ABNORMAL HIGH (ref 8–27)
CO2: 24 mmol/L (ref 20–29)
Calcium: 9.5 mg/dL (ref 8.6–10.2)
Chloride: 104 mmol/L (ref 96–106)
Creatinine, Ser: 1.38 mg/dL — ABNORMAL HIGH (ref 0.76–1.27)
Glucose: 102 mg/dL — ABNORMAL HIGH (ref 70–99)
Potassium: 4.5 mmol/L (ref 3.5–5.2)
Sodium: 140 mmol/L (ref 134–144)
eGFR: 51 mL/min/{1.73_m2} — ABNORMAL LOW (ref >59)

## 2021-09-04 LAB — PRO B NATRIURETIC PEPTIDE: NT-Pro BNP: 917 pg/mL — ABNORMAL HIGH (ref 0–486)

## 2021-09-11 ENCOUNTER — Telehealth: Payer: Self-pay | Admitting: Cardiology

## 2021-09-11 NOTE — Telephone Encounter (Signed)
Patient's wife is returning CMA's call.

## 2021-09-11 NOTE — Telephone Encounter (Signed)
Patient notified

## 2021-09-11 NOTE — Telephone Encounter (Signed)
-----   Message from Park Liter, MD sent at 09/09/2021 10:34 AM EDT ----- Basically not changing anything.  None of those blood tests are critical. ----- Message ----- From: Darrel Reach, CMA Sent: 09/06/2021   4:41 PM EDT To: Park Liter, MD  Can you clarify your 2ns sentence? ----- Message ----- From: Park Liter, MD Sent: 09/05/2021   8:25 AM EDT To: Tyler Pita, RN  Labs show mild congestive heart failure, Probably had swelling in legs.  If swelling gets worse he need to does not know.  There is also some kidney dysfunction but, reluctant to increase dosages of medications

## 2021-10-31 ENCOUNTER — Ambulatory Visit: Payer: Medicare Other | Admitting: Cardiology

## 2021-11-11 ENCOUNTER — Other Ambulatory Visit: Payer: Self-pay | Admitting: Cardiology

## 2021-11-11 DIAGNOSIS — I482 Chronic atrial fibrillation, unspecified: Secondary | ICD-10-CM

## 2021-11-11 NOTE — Telephone Encounter (Signed)
Prescription refill request for Eliquis received. Indication: Afib  Last office visit: 09/03/21 Nicholas Pratt)  Scr: 1.38 (09/03/21)  Age: 81 Weight: 80kg  Appropriate dose and refill sent to requested pharmacy.

## 2021-12-18 DIAGNOSIS — F0280 Dementia in other diseases classified elsewhere without behavioral disturbance: Secondary | ICD-10-CM | POA: Diagnosis not present

## 2021-12-18 DIAGNOSIS — G309 Alzheimer's disease, unspecified: Secondary | ICD-10-CM | POA: Diagnosis not present

## 2021-12-18 DIAGNOSIS — Z7901 Long term (current) use of anticoagulants: Secondary | ICD-10-CM | POA: Diagnosis not present

## 2021-12-18 DIAGNOSIS — Z0000 Encounter for general adult medical examination without abnormal findings: Secondary | ICD-10-CM | POA: Diagnosis not present

## 2021-12-18 DIAGNOSIS — Z6824 Body mass index (BMI) 24.0-24.9, adult: Secondary | ICD-10-CM | POA: Diagnosis not present

## 2022-02-04 DIAGNOSIS — E7800 Pure hypercholesterolemia, unspecified: Secondary | ICD-10-CM | POA: Diagnosis not present

## 2022-02-04 DIAGNOSIS — I4891 Unspecified atrial fibrillation: Secondary | ICD-10-CM | POA: Diagnosis not present

## 2022-02-04 DIAGNOSIS — Z043 Encounter for examination and observation following other accident: Secondary | ICD-10-CM | POA: Diagnosis not present

## 2022-02-04 DIAGNOSIS — I700 Atherosclerosis of aorta: Secondary | ICD-10-CM | POA: Diagnosis not present

## 2022-02-04 DIAGNOSIS — R9082 White matter disease, unspecified: Secondary | ICD-10-CM | POA: Diagnosis not present

## 2022-02-04 DIAGNOSIS — R002 Palpitations: Secondary | ICD-10-CM | POA: Diagnosis not present

## 2022-02-04 DIAGNOSIS — F0390 Unspecified dementia without behavioral disturbance: Secondary | ICD-10-CM | POA: Diagnosis not present

## 2022-02-04 DIAGNOSIS — W19XXXA Unspecified fall, initial encounter: Secondary | ICD-10-CM | POA: Diagnosis not present

## 2022-02-13 DIAGNOSIS — R609 Edema, unspecified: Secondary | ICD-10-CM | POA: Diagnosis not present

## 2022-02-13 DIAGNOSIS — F0280 Dementia in other diseases classified elsewhere without behavioral disturbance: Secondary | ICD-10-CM | POA: Diagnosis not present

## 2022-02-13 DIAGNOSIS — R32 Unspecified urinary incontinence: Secondary | ICD-10-CM | POA: Diagnosis not present

## 2022-02-13 DIAGNOSIS — G309 Alzheimer's disease, unspecified: Secondary | ICD-10-CM | POA: Diagnosis not present

## 2022-02-17 ENCOUNTER — Other Ambulatory Visit: Payer: Self-pay | Admitting: Cardiology

## 2022-02-27 DIAGNOSIS — G309 Alzheimer's disease, unspecified: Secondary | ICD-10-CM | POA: Diagnosis not present

## 2022-02-27 DIAGNOSIS — F0280 Dementia in other diseases classified elsewhere without behavioral disturbance: Secondary | ICD-10-CM | POA: Diagnosis not present

## 2022-02-27 DIAGNOSIS — E782 Mixed hyperlipidemia: Secondary | ICD-10-CM | POA: Diagnosis not present

## 2022-02-27 DIAGNOSIS — M419 Scoliosis, unspecified: Secondary | ICD-10-CM | POA: Diagnosis not present

## 2022-02-27 DIAGNOSIS — Z9181 History of falling: Secondary | ICD-10-CM | POA: Diagnosis not present

## 2022-02-27 DIAGNOSIS — I2510 Atherosclerotic heart disease of native coronary artery without angina pectoris: Secondary | ICD-10-CM | POA: Diagnosis not present

## 2022-02-27 DIAGNOSIS — M109 Gout, unspecified: Secondary | ICD-10-CM | POA: Diagnosis not present

## 2022-02-27 DIAGNOSIS — Z7901 Long term (current) use of anticoagulants: Secondary | ICD-10-CM | POA: Diagnosis not present

## 2022-02-27 DIAGNOSIS — G319 Degenerative disease of nervous system, unspecified: Secondary | ICD-10-CM | POA: Diagnosis not present

## 2022-02-27 DIAGNOSIS — D120 Benign neoplasm of cecum: Secondary | ICD-10-CM | POA: Diagnosis not present

## 2022-02-27 DIAGNOSIS — I4891 Unspecified atrial fibrillation: Secondary | ICD-10-CM | POA: Diagnosis not present

## 2022-02-27 DIAGNOSIS — I700 Atherosclerosis of aorta: Secondary | ICD-10-CM | POA: Diagnosis not present

## 2022-02-27 DIAGNOSIS — Z87891 Personal history of nicotine dependence: Secondary | ICD-10-CM | POA: Diagnosis not present

## 2022-02-27 DIAGNOSIS — R32 Unspecified urinary incontinence: Secondary | ICD-10-CM | POA: Diagnosis not present

## 2022-03-04 DIAGNOSIS — M419 Scoliosis, unspecified: Secondary | ICD-10-CM | POA: Diagnosis not present

## 2022-03-04 DIAGNOSIS — I4891 Unspecified atrial fibrillation: Secondary | ICD-10-CM | POA: Diagnosis not present

## 2022-03-04 DIAGNOSIS — F0280 Dementia in other diseases classified elsewhere without behavioral disturbance: Secondary | ICD-10-CM | POA: Diagnosis not present

## 2022-03-04 DIAGNOSIS — I700 Atherosclerosis of aorta: Secondary | ICD-10-CM | POA: Diagnosis not present

## 2022-03-04 DIAGNOSIS — G309 Alzheimer's disease, unspecified: Secondary | ICD-10-CM | POA: Diagnosis not present

## 2022-03-04 DIAGNOSIS — G319 Degenerative disease of nervous system, unspecified: Secondary | ICD-10-CM | POA: Diagnosis not present

## 2022-03-06 ENCOUNTER — Ambulatory Visit: Payer: Medicare Other | Attending: Cardiology | Admitting: Cardiology

## 2022-03-06 ENCOUNTER — Encounter: Payer: Self-pay | Admitting: Cardiology

## 2022-03-06 VITALS — BP 86/50 | HR 82 | Ht 71.0 in | Wt 178.0 lb

## 2022-03-06 DIAGNOSIS — I482 Chronic atrial fibrillation, unspecified: Secondary | ICD-10-CM | POA: Insufficient documentation

## 2022-03-06 DIAGNOSIS — F0280 Dementia in other diseases classified elsewhere without behavioral disturbance: Secondary | ICD-10-CM | POA: Diagnosis not present

## 2022-03-06 DIAGNOSIS — E782 Mixed hyperlipidemia: Secondary | ICD-10-CM | POA: Insufficient documentation

## 2022-03-06 DIAGNOSIS — I10 Essential (primary) hypertension: Secondary | ICD-10-CM | POA: Insufficient documentation

## 2022-03-06 DIAGNOSIS — G309 Alzheimer's disease, unspecified: Secondary | ICD-10-CM | POA: Diagnosis not present

## 2022-03-06 DIAGNOSIS — I25118 Atherosclerotic heart disease of native coronary artery with other forms of angina pectoris: Secondary | ICD-10-CM | POA: Insufficient documentation

## 2022-03-06 DIAGNOSIS — R001 Bradycardia, unspecified: Secondary | ICD-10-CM | POA: Diagnosis not present

## 2022-03-06 DIAGNOSIS — I700 Atherosclerosis of aorta: Secondary | ICD-10-CM | POA: Diagnosis not present

## 2022-03-06 DIAGNOSIS — R0609 Other forms of dyspnea: Secondary | ICD-10-CM | POA: Insufficient documentation

## 2022-03-06 DIAGNOSIS — M419 Scoliosis, unspecified: Secondary | ICD-10-CM | POA: Diagnosis not present

## 2022-03-06 DIAGNOSIS — I4891 Unspecified atrial fibrillation: Secondary | ICD-10-CM | POA: Diagnosis not present

## 2022-03-06 DIAGNOSIS — G319 Degenerative disease of nervous system, unspecified: Secondary | ICD-10-CM | POA: Diagnosis not present

## 2022-03-06 NOTE — Progress Notes (Signed)
Cardiology Office Note:    Date:  03/06/2022   ID:  Nicholas Pratt, DOB 05-10-1940, MRN [id number]  PCP:  Nicholas Flow, MD  Cardiologist:  Nicholas Campus, MD    Referring MD: Nicholas Flow, MD   No chief complaint on file.   History of Present Illness:    Nicholas Pratt is a 82 y.o. male  with past medical history significant for permanent atrial fibrillation, coronary artery disease, in 2017 he had cardiac catheterization which showed completely occluded proximal portion of the right coronary artery with no significant disease elsewhere. Additional issues include essential hypertension, dyslipidemia. Also he did have echocardiogram done at the beginning of the year showed he was found to have cardiomyopathy with ejection fraction 40%, however, we have difficulty putting him on medications because of low blood pressure.  Comes today to my office for follow-up.  Overall he seems to be doing well look like dementia is progressing.  Couple days ago he end up being in the emergency room because of fall.,  Workup has been negative with no sure exactly what happened as he was alone he is wife finding crawling on the floor.  He does not recall those events.  Likely all workup in the emergency was negative.  He denies have any symptoms there is no chest pain tightness squeezing pressure in chest no palpitations no swelling of lower extremities  Past Medical History:  Diagnosis Date   A-fib ([place])    Coronary artery disease    Hypertension     Past Surgical History:  Procedure Laterality Date   APPENDECTOMY     BACK SURGERY     CARDIAC CATHETERIZATION     CORONARY ANGIOPLASTY     FOOT SURGERY     INGUINAL HERNIA REPAIR     TONSILLECTOMY AND ADENOIDECTOMY      Current Medications: Current Meds  Medication Sig   apixaban (ELIQUIS) 5 MG TABS tablet TAKE 1 TABLET BY MOUTH TWICE A DAY   donepezil (ARICEPT) 5 MG tablet Take 10 mg by mouth daily.    metoprolol tartrate (LOPRESSOR) 25 MG  tablet Take 12.5 mg by mouth 2 (two) times daily.   sacubitril-valsartan (ENTRESTO) 24-26 MG Take 1 tablet by mouth 2 (two) times daily.   solifenacin (VESICARE) 5 MG tablet Take 5 mg by mouth daily.     Allergies:   Oxycodone   Social History   Socioeconomic History   Marital status: Married    Spouse name: Not on file   Number of children: Not on file   Years of education: Not on file   Highest education level: Not on file  Occupational History   Not on file  Tobacco Use   Smoking status: Former   Smokeless tobacco: Never  Vaping Use   Vaping Use: Never used  Substance and Sexual Activity   Alcohol use: No   Drug use: No   Sexual activity: Not on file  Other Topics Concern   Not on file  Social History Narrative   Not on file   Social Determinants of Health   Financial Resource Strain: Not on file  Food Insecurity: Not on file  Transportation Needs: Not on file  Physical Activity: Not on file  Stress: Not on file  Social Connections: Not on file     Family History: The patient's family history includes Heart disease in his father and mother. ROS:   Please see the history of present illness.    All  14 point review of systems negative except as described per history of present illness  EKGs/Labs/Other Studies Reviewed:      Recent Labs: 09/03/2021: BUN 30; Creatinine, Ser 1.38; NT-Pro BNP 917; Potassium 4.5; Sodium 140  Recent Lipid Panel    Component Value Date/Time   CHOL 147 10/22/2016 1459   TRIG 182 (H) 10/22/2016 1459   HDL 37 (L) 10/22/2016 1459   CHOLHDL 4.0 10/22/2016 1459   LDLCALC 74 10/22/2016 1459   LDLDIRECT 83 01/18/2021 1550    Physical Exam:    VS:  BP (!) 86/50 (BP Location: Right Arm, Patient Position: Sitting)   Pulse 82   Ht '5\' 11"'$  (1.803 m)   Wt 178 lb (80.7 kg)   SpO2 (!) 87%   BMI 24.83 kg/m     Wt Readings from Last 3 Encounters:  03/06/22 178 lb (80.7 kg)  09/03/21 176 lb 6.4 oz (80 kg)  01/18/21 184 lb 6.4 oz (83.6  kg)     GEN:  Well nourished, well developed in no acute distress HEENT: Normal NECK: No JVD; No carotid bruits LYMPHATICS: No lymphadenopathy CARDIAC: Irregular irregular, no murmurs, no rubs, no gallops RESPIRATORY:  Clear to auscultation without rales, wheezing or rhonchi  ABDOMEN: Soft, non-tender, non-distended MUSCULOSKELETAL:  No edema; No deformity  SKIN: Warm and dry LOWER EXTREMITIES: no swelling NEUROLOGIC:  Alert and oriented x 3 PSYCHIATRIC:  Normal affect   ASSESSMENT:    1. Chronic atrial fibrillation ([place])   2. Coronary artery disease of native artery of native heart with stable angina pectoris ([place])   3. Essential hypertension   4. Bradycardia   5. Mixed dyslipidemia    PLAN:    In order of problems listed above:  Chronic week atrial fibrillation.  He is anticoagulant which I continue.  He is dose is appropriate to his weight kidney function and age.  Will continue. Coronary disease stable denies have any problem. Essential hypertension with facing up with the problem blood pressure being low. Cardiomyopathy with mildly diminished ejection fraction we will recheck his echocardiogram to recheck left ventricle ejection fraction.  I will do also EKG anticipate need to discontinue his metoprolol which creates significant bradycardia. Mixed dyslipidemia, I did review K PN which show me his LDL of 83.  Will continue present management   Medication Adjustments/Labs and Tests Ordered: Current medicines are reviewed at length with the patient today.  Concerns regarding medicines are outlined above.  No orders of the defined types were placed in this encounter.  Medication changes: No orders of the defined types were placed in this encounter.   Signed, Nicholas Liter, MD, [facility] 03/06/2022 9:37 AM    [place]

## 2022-03-06 NOTE — Patient Instructions (Signed)
Medication Instructions:   STOP: Metoprolol   Lab Work: None Ordered If you have labs (blood work) drawn today and your tests are completely normal, you will receive your results only by: [place] (if you have MyChart) OR A paper copy in the mail If you have any lab test that is abnormal or we need to change your treatment, we will call you to review the results.   Testing/Procedures: Your physician has requested that you have an echocardiogram. Echocardiography is a painless test that uses sound waves to create images of your heart. It provides your doctor with information about the size and shape of your heart and how well your heart's chambers and valves are working. This procedure takes approximately one hour. There are no restrictions for this procedure. Please do NOT wear cologne, perfume, aftershave, or lotions (deodorant is allowed). Please arrive 15 minutes prior to your appointment time.'    Follow-Up: At [facility], you and your health needs are our priority.  As part of our continuing mission to provide you with exceptional heart care, we have created designated Provider Care Teams.  These Care Teams include your primary Cardiologist (physician) and Advanced Practice Providers (APPs -  Physician Assistants and Nurse Practitioners) who all work together to provide you with the care you need, when you need it.  We recommend signing up for the patient portal called "MyChart".  Sign up information is provided on this After Visit Summary.  MyChart is used to connect with patients for Virtual Visits (Telemedicine).  Patients are able to view lab/test results, encounter notes, upcoming appointments, etc.  Non-urgent messages can be sent to your provider as well.   To learn more about what you can do with MyChart, go to [email or web address].    Your next appointment:   6 month(s)  The format for your next appointment:   In Person  Provider:   Jenne Campus, MD     Other Instructions NA

## 2022-03-11 DIAGNOSIS — G319 Degenerative disease of nervous system, unspecified: Secondary | ICD-10-CM | POA: Diagnosis not present

## 2022-03-11 DIAGNOSIS — M419 Scoliosis, unspecified: Secondary | ICD-10-CM | POA: Diagnosis not present

## 2022-03-11 DIAGNOSIS — I700 Atherosclerosis of aorta: Secondary | ICD-10-CM | POA: Diagnosis not present

## 2022-03-11 DIAGNOSIS — F0280 Dementia in other diseases classified elsewhere without behavioral disturbance: Secondary | ICD-10-CM | POA: Diagnosis not present

## 2022-03-11 DIAGNOSIS — G309 Alzheimer's disease, unspecified: Secondary | ICD-10-CM | POA: Diagnosis not present

## 2022-03-11 DIAGNOSIS — I4891 Unspecified atrial fibrillation: Secondary | ICD-10-CM | POA: Diagnosis not present

## 2022-03-14 DIAGNOSIS — G319 Degenerative disease of nervous system, unspecified: Secondary | ICD-10-CM | POA: Diagnosis not present

## 2022-03-14 DIAGNOSIS — M419 Scoliosis, unspecified: Secondary | ICD-10-CM | POA: Diagnosis not present

## 2022-03-14 DIAGNOSIS — G309 Alzheimer's disease, unspecified: Secondary | ICD-10-CM | POA: Diagnosis not present

## 2022-03-14 DIAGNOSIS — I700 Atherosclerosis of aorta: Secondary | ICD-10-CM | POA: Diagnosis not present

## 2022-03-14 DIAGNOSIS — I4891 Unspecified atrial fibrillation: Secondary | ICD-10-CM | POA: Diagnosis not present

## 2022-03-14 DIAGNOSIS — F0280 Dementia in other diseases classified elsewhere without behavioral disturbance: Secondary | ICD-10-CM | POA: Diagnosis not present

## 2022-03-17 DIAGNOSIS — Z79899 Other long term (current) drug therapy: Secondary | ICD-10-CM | POA: Diagnosis not present

## 2022-03-17 DIAGNOSIS — I4891 Unspecified atrial fibrillation: Secondary | ICD-10-CM | POA: Diagnosis not present

## 2022-03-17 DIAGNOSIS — R001 Bradycardia, unspecified: Secondary | ICD-10-CM | POA: Diagnosis not present

## 2022-03-17 DIAGNOSIS — R531 Weakness: Secondary | ICD-10-CM | POA: Diagnosis not present

## 2022-03-17 DIAGNOSIS — Z7901 Long term (current) use of anticoagulants: Secondary | ICD-10-CM | POA: Diagnosis not present

## 2022-03-17 DIAGNOSIS — R110 Nausea: Secondary | ICD-10-CM | POA: Diagnosis not present

## 2022-03-17 DIAGNOSIS — I959 Hypotension, unspecified: Secondary | ICD-10-CM | POA: Diagnosis not present

## 2022-03-17 DIAGNOSIS — U071 COVID-19: Secondary | ICD-10-CM | POA: Diagnosis not present

## 2022-03-17 DIAGNOSIS — J9811 Atelectasis: Secondary | ICD-10-CM | POA: Diagnosis not present

## 2022-03-17 DIAGNOSIS — R410 Disorientation, unspecified: Secondary | ICD-10-CM | POA: Diagnosis not present

## 2022-03-18 ENCOUNTER — Telehealth: Payer: Self-pay | Admitting: Cardiology

## 2022-03-18 DIAGNOSIS — I959 Hypotension, unspecified: Secondary | ICD-10-CM | POA: Diagnosis not present

## 2022-03-18 DIAGNOSIS — I361 Nonrheumatic tricuspid (valve) insufficiency: Secondary | ICD-10-CM | POA: Diagnosis not present

## 2022-03-18 DIAGNOSIS — U071 COVID-19: Secondary | ICD-10-CM | POA: Diagnosis not present

## 2022-03-18 DIAGNOSIS — I4891 Unspecified atrial fibrillation: Secondary | ICD-10-CM | POA: Diagnosis not present

## 2022-03-18 DIAGNOSIS — I340 Nonrheumatic mitral (valve) insufficiency: Secondary | ICD-10-CM | POA: Diagnosis not present

## 2022-03-18 NOTE — Telephone Encounter (Signed)
Wife called to report patient is currently admitted to hospital and canceled Echo test stating patient will have Echo in the hospital.

## 2022-03-19 ENCOUNTER — Ambulatory Visit: Payer: Medicare Other

## 2022-03-21 DIAGNOSIS — F0280 Dementia in other diseases classified elsewhere without behavioral disturbance: Secondary | ICD-10-CM | POA: Diagnosis not present

## 2022-03-21 DIAGNOSIS — G309 Alzheimer's disease, unspecified: Secondary | ICD-10-CM | POA: Diagnosis not present

## 2022-03-21 DIAGNOSIS — I700 Atherosclerosis of aorta: Secondary | ICD-10-CM | POA: Diagnosis not present

## 2022-03-21 DIAGNOSIS — I4891 Unspecified atrial fibrillation: Secondary | ICD-10-CM | POA: Diagnosis not present

## 2022-03-21 DIAGNOSIS — M419 Scoliosis, unspecified: Secondary | ICD-10-CM | POA: Diagnosis not present

## 2022-03-21 DIAGNOSIS — G319 Degenerative disease of nervous system, unspecified: Secondary | ICD-10-CM | POA: Diagnosis not present

## 2022-03-24 DIAGNOSIS — F0280 Dementia in other diseases classified elsewhere without behavioral disturbance: Secondary | ICD-10-CM | POA: Diagnosis not present

## 2022-03-24 DIAGNOSIS — Z8616 Personal history of COVID-19: Secondary | ICD-10-CM | POA: Diagnosis not present

## 2022-03-24 DIAGNOSIS — G309 Alzheimer's disease, unspecified: Secondary | ICD-10-CM | POA: Diagnosis not present

## 2022-03-24 DIAGNOSIS — I2510 Atherosclerotic heart disease of native coronary artery without angina pectoris: Secondary | ICD-10-CM | POA: Diagnosis not present

## 2022-03-27 DIAGNOSIS — G309 Alzheimer's disease, unspecified: Secondary | ICD-10-CM | POA: Diagnosis not present

## 2022-03-27 DIAGNOSIS — F0280 Dementia in other diseases classified elsewhere without behavioral disturbance: Secondary | ICD-10-CM | POA: Diagnosis not present

## 2022-03-27 DIAGNOSIS — M419 Scoliosis, unspecified: Secondary | ICD-10-CM | POA: Diagnosis not present

## 2022-03-27 DIAGNOSIS — I700 Atherosclerosis of aorta: Secondary | ICD-10-CM | POA: Diagnosis not present

## 2022-03-27 DIAGNOSIS — I4891 Unspecified atrial fibrillation: Secondary | ICD-10-CM | POA: Diagnosis not present

## 2022-03-27 DIAGNOSIS — G319 Degenerative disease of nervous system, unspecified: Secondary | ICD-10-CM | POA: Diagnosis not present

## 2022-03-28 DIAGNOSIS — I4891 Unspecified atrial fibrillation: Secondary | ICD-10-CM | POA: Diagnosis not present

## 2022-03-28 DIAGNOSIS — M419 Scoliosis, unspecified: Secondary | ICD-10-CM | POA: Diagnosis not present

## 2022-03-28 DIAGNOSIS — G319 Degenerative disease of nervous system, unspecified: Secondary | ICD-10-CM | POA: Diagnosis not present

## 2022-03-28 DIAGNOSIS — I700 Atherosclerosis of aorta: Secondary | ICD-10-CM | POA: Diagnosis not present

## 2022-03-28 DIAGNOSIS — G309 Alzheimer's disease, unspecified: Secondary | ICD-10-CM | POA: Diagnosis not present

## 2022-03-28 DIAGNOSIS — F0280 Dementia in other diseases classified elsewhere without behavioral disturbance: Secondary | ICD-10-CM | POA: Diagnosis not present

## 2022-03-29 DIAGNOSIS — G309 Alzheimer's disease, unspecified: Secondary | ICD-10-CM | POA: Diagnosis not present

## 2022-03-29 DIAGNOSIS — G319 Degenerative disease of nervous system, unspecified: Secondary | ICD-10-CM | POA: Diagnosis not present

## 2022-03-29 DIAGNOSIS — R32 Unspecified urinary incontinence: Secondary | ICD-10-CM | POA: Diagnosis not present

## 2022-03-29 DIAGNOSIS — Z9181 History of falling: Secondary | ICD-10-CM | POA: Diagnosis not present

## 2022-03-29 DIAGNOSIS — I700 Atherosclerosis of aorta: Secondary | ICD-10-CM | POA: Diagnosis not present

## 2022-03-29 DIAGNOSIS — M419 Scoliosis, unspecified: Secondary | ICD-10-CM | POA: Diagnosis not present

## 2022-03-29 DIAGNOSIS — I2510 Atherosclerotic heart disease of native coronary artery without angina pectoris: Secondary | ICD-10-CM | POA: Diagnosis not present

## 2022-03-29 DIAGNOSIS — D120 Benign neoplasm of cecum: Secondary | ICD-10-CM | POA: Diagnosis not present

## 2022-03-29 DIAGNOSIS — I4891 Unspecified atrial fibrillation: Secondary | ICD-10-CM | POA: Diagnosis not present

## 2022-03-29 DIAGNOSIS — Z87891 Personal history of nicotine dependence: Secondary | ICD-10-CM | POA: Diagnosis not present

## 2022-03-29 DIAGNOSIS — Z7901 Long term (current) use of anticoagulants: Secondary | ICD-10-CM | POA: Diagnosis not present

## 2022-03-29 DIAGNOSIS — F0280 Dementia in other diseases classified elsewhere without behavioral disturbance: Secondary | ICD-10-CM | POA: Diagnosis not present

## 2022-03-29 DIAGNOSIS — E782 Mixed hyperlipidemia: Secondary | ICD-10-CM | POA: Diagnosis not present

## 2022-03-29 DIAGNOSIS — M109 Gout, unspecified: Secondary | ICD-10-CM | POA: Diagnosis not present

## 2022-03-31 ENCOUNTER — Ambulatory Visit (INDEPENDENT_AMBULATORY_CARE_PROVIDER_SITE_OTHER): Payer: Medicare Other | Admitting: Neurology

## 2022-03-31 ENCOUNTER — Encounter: Payer: Self-pay | Admitting: Neurology

## 2022-03-31 VITALS — BP 120/80 | HR 65 | Ht 71.0 in | Wt 179.5 lb

## 2022-03-31 DIAGNOSIS — G301 Alzheimer's disease with late onset: Secondary | ICD-10-CM

## 2022-03-31 DIAGNOSIS — F02B0 Dementia in other diseases classified elsewhere, moderate, without behavioral disturbance, psychotic disturbance, mood disturbance, and anxiety: Secondary | ICD-10-CM | POA: Diagnosis not present

## 2022-03-31 NOTE — Patient Instructions (Addendum)
Continue current medications,  Aricept and Namenda Follow up in a year of sooner if worse, if experiencing worsening hallucinations or delusions or agitation

## 2022-03-31 NOTE — Progress Notes (Signed)
[facility]  PATIENT: Nicholas Pratt DOB: 1940/03/23  REQUESTING CLINICIAN: Mateo Flow, MD HISTORY FROM: Patient and family  REASON FOR VISIT: Dementia    HISTORICAL  CHIEF COMPLAINT:  Chief Complaint  Patient presents with   Follow-up    Rm 14. Accompanied by wife and daughter. Alzheimer's f/u. Pt's family states Alzheimer's is progressing. He is sleeping longer, sometimes until 8 PM. He is having more difficulty bathing and dressing.    INTERVAL HISTORY 03/31/2022:  Patient presents today for follow-up, he is accompanied by wife and daughter.  Since last visit a year ago, family has reported that his memory is getting worse.  He is forgetful, cannot remember any recent conversation.  Wife reports at times he cannot recognize her.  They do have home health coming to the house.  He is doing OT and PT.  There is also report of hallucinations, and delusions.  Sometime he speaks to himself in the mirror stating that his friend.  Wife reports sometimes he gets agitated at night but has not been violent.  He has not attempted to leave the house. He has incontinence and needs reminder to shower and change his clothes. He is already on Aricept and Namenda.    HISTORY OF PRESENT ILLNESS:  This is a 82 year old gentleman with past medical history of atrial fibrillation, hypertension, hyperlipidemia and coronary artery disease who is presenting with family for memory problem for the past 3 to 4 years.  Per patient, he reported his memory is fine, he does not have any issue with memory, his only problem is his knee pain that prevent him from going golfing.   Per wife patient has been struggling with memory decline for the past 3 to 4 years, he is forgetful about recent conversations, he repeats himself multiple times and he asked the same questions over and over.  Wife has to remind him about important events like appointment multiple times.  Wife reported he loved to do word  games on his phone but stopped doing it about 3 years ago.  He does not recall specific name or specific places but he will tend to generalize saying "you know what I am mean".  Sometimes, he is very confused. He still drives, denies any recent accident but only drive to familiar places meaning to the golf course and to his local hangout spot.  Wife reports he did have some weird dream but he does not act out his dreams.  Another thing that she mentioned is his mood swing, he gets irritable very easily.  Currently he is on Aricept 10 mg and also on Namenda 14 mg.   He had a head CT in 2020 showing atrophy with patchy supratentorial small vessel disease.    OTHER MEDICAL CONDITIONS: Afib, HLD, CAD, Dementia    REVIEW OF SYSTEMS: Full 14 system review of systems performed and negative with exception of: as noted in the HPI  ALLERGIES: Allergies  Allergen Reactions   Oxycodone Nausea And Vomiting    HOME MEDICATIONS: Outpatient Medications Prior to Visit  Medication Sig Dispense Refill   apixaban (ELIQUIS) 5 MG TABS tablet TAKE 1 TABLET BY MOUTH TWICE A DAY 180 tablet 1   atorvastatin (LIPITOR) 10 MG tablet Take 1 tablet (10 mg total) by mouth daily. 30 tablet 11   citalopram (CELEXA) 10 MG tablet Take 10 mg by mouth every morning.     donepezil (ARICEPT) 5 MG tablet Take 10 mg by mouth daily.  memantine (NAMENDA) 10 MG tablet Take 10 mg by mouth 2 (two) times daily.     solifenacin (VESICARE) 5 MG tablet Take 5 mg by mouth daily.     nitroGLYCERIN (NITROSTAT) 0.4 MG SL tablet Place 0.4 mg under the tongue as needed for chest pain. (Patient not taking: Reported on 03/06/2022)     sacubitril-valsartan (ENTRESTO) 24-26 MG Take 1 tablet by mouth 2 (two) times daily. 180 tablet 1   No facility-administered medications prior to visit.    PAST MEDICAL HISTORY: Past Medical History:  Diagnosis Date   A-fib [facility])    Coronary artery disease    Hypertension     PAST SURGICAL  HISTORY: Past Surgical History:  Procedure Laterality Date   APPENDECTOMY     BACK SURGERY     CARDIAC CATHETERIZATION     CORONARY ANGIOPLASTY     FOOT SURGERY     INGUINAL HERNIA REPAIR     TONSILLECTOMY AND ADENOIDECTOMY      FAMILY HISTORY: Family History  Problem Relation Age of Onset   Heart disease Mother    Heart disease Father     SOCIAL HISTORY: Social History   Socioeconomic History   Marital status: Married    Spouse name: Not on file   Number of children: Not on file   Years of education: Not on file   Highest education level: Not on file  Occupational History   Not on file  Tobacco Use   Smoking status: Former   Smokeless tobacco: Never  Vaping Use   Vaping Use: Never used  Substance and Sexual Activity   Alcohol use: No   Drug use: No   Sexual activity: Not on file  Other Topics Concern   Not on file  Social History Narrative   Not on file   Social Determinants of Health   Financial Resource Strain: Not on file  Food Insecurity: Not on file  Transportation Needs: Not on file  Physical Activity: Not on file  Stress: Not on file  Social Connections: Not on file  Intimate Partner Violence: Not on file    PHYSICAL EXAM  GENERAL EXAM/CONSTITUTIONAL: Vitals:  Vitals:   03/31/22 1402  BP: 120/80  Pulse: 65  Weight: 179 lb 8 oz (81.4 kg)  Height: 5\' 11"  (1.803 m)   Body mass index is 25.04 kg/m. Wt Readings from Last 3 Encounters:  03/31/22 179 lb 8 oz (81.4 kg)  03/06/22 178 lb (80.7 kg)  09/03/21 176 lb 6.4 oz (80 kg)   Patient is in no distress; well developed, nourished and groomed; neck is supple  EYES: Visual fields full to confrontation, Extraocular movements intacts,   MUSCULOSKELETAL: Gait, strength, tone, movements noted in Neurologic exam below  NEUROLOGIC: MENTAL STATUS:     01/10/2021    9:45 AM  MMSE - Mini Mental State Exam  Orientation to time 1  Orientation to Place 2  Registration 3  Attention/  Calculation 0  Recall 0  Language- name 2 objects 2  Language- repeat 1  Language- follow 3 step command 3  Language- read & follow direction 1  Write a sentence 0  Copy design 1  Total score 14    CRANIAL NERVE:  2nd, 3rd, 4th, 6th - visual fields full to confrontation, extraocular muscles intact, no nystagmus 5th - facial sensation symmetric 7th - facial strength symmetric 8th - hearing intact 9th - palate elevates symmetrically, uvula midline 11th - shoulder shrug symmetric 12th - tongue protrusion midline  MOTOR:  normal bulk and tone, full strength in the BUE, BLE  SENSORY:  normal and symmetric to light touch  COORDINATION:  finger-nose-finger, fine finger movements normal  REFLEXES:  deep tendon reflexes present and symmetric  GAIT/STATION:  Stoop forward, antalgic gait, refuses to uses a walker or cane     DIAGNOSTIC DATA (LABS, IMAGING, TESTING) - I reviewed patient records, labs, notes, testing and imaging myself where available.  No results found for: "WBC", "HGB", "HCT", "MCV", "PLT"    Component Value Date/Time   NA 140 09/03/2021 1616   K 4.5 09/03/2021 1616   CL 104 09/03/2021 1616   CO2 24 09/03/2021 1616   GLUCOSE 102 (H) 09/03/2021 1616   BUN 30 (H) 09/03/2021 1616   CREATININE 1.38 (H) 09/03/2021 1616   CALCIUM 9.5 09/03/2021 1616   PROT 6.5 10/22/2016 1504   ALBUMIN 4.3 10/22/2016 1504   AST 23 10/22/2016 1504   ALT 12 10/22/2016 1504   ALKPHOS 52 10/22/2016 1504   BILITOT 0.6 10/22/2016 1504   Lab Results  Component Value Date   CHOL 147 10/22/2016   HDL 37 (L) 10/22/2016   LDLCALC 74 10/22/2016   LDLDIRECT 83 01/18/2021   TRIG 182 (H) 10/22/2016   CHOLHDL 4.0 10/22/2016   No results found for: "HGBA1C" No results found for: "VITAMINB12" No results found for: "TSH"  Head CT 04/2018 Atrophy with patchy supratentorial small vessel disease.  Focus of decreased attenuation in the gray-white junction in the inferior, anterior  right frontal lobe, a finding that may represent a recent small infarct.  No other findings suggestive recent/ potentially acute infarct evident.  No mass or hemorrhage.   His last TSH and B12 was reported normal  EEG 01/16/2021: Diffuse slowing  ASSESSMENT AND PLAN  82 y.o. year old male with atrial fibrillation, hypertension, hyperlipidemia and coronary artery disease here for follow up for his Alzheimer dementia.  He is already on Aricept and Namenda and we will continue the same. Advised them to call me if he experiences any worsening hallucination, agitation or any other concerns.    1. Moderate late onset Alzheimer's dementia without behavioral disturbance, psychotic disturbance, mood disturbance, or anxiety (HCC)      Patient Instructions  Continue current medications,  Aricept and Namenda Follow up in a year of sooner if worse, if experiencing worsening hallucinations or delusions or agitation  No orders of the defined types were placed in this encounter.   No orders of the defined types were placed in this encounter.   Return in about 1 year (around 03/31/2023).  I have spent a total of 45 minutes dedicated to this patient today, preparing to see patient, performing a medically appropriate examination and evaluation, ordering tests and/or medications and procedures, and counseling and educating the patient/family/caregiver; independently interpreting result and communicating results to the family/patient/caregiver; and documenting clinical information in the electronic medical record.   Alric Ran, MD 03/31/2022, 4:22 PM  [facility] [address] [place], [place] [address] [phone]

## 2022-04-01 DIAGNOSIS — F0280 Dementia in other diseases classified elsewhere without behavioral disturbance: Secondary | ICD-10-CM | POA: Diagnosis not present

## 2022-04-01 DIAGNOSIS — I4891 Unspecified atrial fibrillation: Secondary | ICD-10-CM | POA: Diagnosis not present

## 2022-04-01 DIAGNOSIS — G309 Alzheimer's disease, unspecified: Secondary | ICD-10-CM | POA: Diagnosis not present

## 2022-04-01 DIAGNOSIS — I700 Atherosclerosis of aorta: Secondary | ICD-10-CM | POA: Diagnosis not present

## 2022-04-01 DIAGNOSIS — G319 Degenerative disease of nervous system, unspecified: Secondary | ICD-10-CM | POA: Diagnosis not present

## 2022-04-01 DIAGNOSIS — M419 Scoliosis, unspecified: Secondary | ICD-10-CM | POA: Diagnosis not present

## 2022-04-02 DIAGNOSIS — I700 Atherosclerosis of aorta: Secondary | ICD-10-CM | POA: Diagnosis not present

## 2022-04-02 DIAGNOSIS — M419 Scoliosis, unspecified: Secondary | ICD-10-CM | POA: Diagnosis not present

## 2022-04-02 DIAGNOSIS — G319 Degenerative disease of nervous system, unspecified: Secondary | ICD-10-CM | POA: Diagnosis not present

## 2022-04-02 DIAGNOSIS — F0280 Dementia in other diseases classified elsewhere without behavioral disturbance: Secondary | ICD-10-CM | POA: Diagnosis not present

## 2022-04-02 DIAGNOSIS — I4891 Unspecified atrial fibrillation: Secondary | ICD-10-CM | POA: Diagnosis not present

## 2022-04-02 DIAGNOSIS — G309 Alzheimer's disease, unspecified: Secondary | ICD-10-CM | POA: Diagnosis not present

## 2022-04-03 ENCOUNTER — Other Ambulatory Visit: Payer: Medicare Other

## 2022-04-10 DIAGNOSIS — F0280 Dementia in other diseases classified elsewhere without behavioral disturbance: Secondary | ICD-10-CM | POA: Diagnosis not present

## 2022-04-10 DIAGNOSIS — G309 Alzheimer's disease, unspecified: Secondary | ICD-10-CM | POA: Diagnosis not present

## 2022-04-10 DIAGNOSIS — I700 Atherosclerosis of aorta: Secondary | ICD-10-CM | POA: Diagnosis not present

## 2022-04-10 DIAGNOSIS — I4891 Unspecified atrial fibrillation: Secondary | ICD-10-CM | POA: Diagnosis not present

## 2022-04-10 DIAGNOSIS — M419 Scoliosis, unspecified: Secondary | ICD-10-CM | POA: Diagnosis not present

## 2022-04-10 DIAGNOSIS — G319 Degenerative disease of nervous system, unspecified: Secondary | ICD-10-CM | POA: Diagnosis not present

## 2022-04-15 DIAGNOSIS — I700 Atherosclerosis of aorta: Secondary | ICD-10-CM | POA: Diagnosis not present

## 2022-04-15 DIAGNOSIS — F0280 Dementia in other diseases classified elsewhere without behavioral disturbance: Secondary | ICD-10-CM | POA: Diagnosis not present

## 2022-04-15 DIAGNOSIS — G319 Degenerative disease of nervous system, unspecified: Secondary | ICD-10-CM | POA: Diagnosis not present

## 2022-04-15 DIAGNOSIS — I4891 Unspecified atrial fibrillation: Secondary | ICD-10-CM | POA: Diagnosis not present

## 2022-04-15 DIAGNOSIS — M419 Scoliosis, unspecified: Secondary | ICD-10-CM | POA: Diagnosis not present

## 2022-04-15 DIAGNOSIS — G309 Alzheimer's disease, unspecified: Secondary | ICD-10-CM | POA: Diagnosis not present

## 2022-04-24 DIAGNOSIS — I4891 Unspecified atrial fibrillation: Secondary | ICD-10-CM | POA: Diagnosis not present

## 2022-04-24 DIAGNOSIS — I700 Atherosclerosis of aorta: Secondary | ICD-10-CM | POA: Diagnosis not present

## 2022-04-24 DIAGNOSIS — G319 Degenerative disease of nervous system, unspecified: Secondary | ICD-10-CM | POA: Diagnosis not present

## 2022-04-24 DIAGNOSIS — G309 Alzheimer's disease, unspecified: Secondary | ICD-10-CM | POA: Diagnosis not present

## 2022-04-24 DIAGNOSIS — M419 Scoliosis, unspecified: Secondary | ICD-10-CM | POA: Diagnosis not present

## 2022-04-24 DIAGNOSIS — F0280 Dementia in other diseases classified elsewhere without behavioral disturbance: Secondary | ICD-10-CM | POA: Diagnosis not present

## 2022-05-04 ENCOUNTER — Other Ambulatory Visit: Payer: Self-pay | Admitting: Cardiology

## 2022-05-04 DIAGNOSIS — I482 Chronic atrial fibrillation, unspecified: Secondary | ICD-10-CM

## 2022-05-05 NOTE — Telephone Encounter (Signed)
Pt last saw Dr Krasowski 03/06/22, last labs 09/03/21 Creat 1.38, age 82, weight 81.4kg, based on specified criteria pt is on Bing Matterriate dosage of Eliquis  BID for afib.  Will refill rx.

## 2022-06-23 DIAGNOSIS — S0990XA Unspecified injury of head, initial encounter: Secondary | ICD-10-CM | POA: Diagnosis not present

## 2022-06-23 DIAGNOSIS — S12100A Unspecified displaced fracture of second cervical vertebra, initial encounter for closed fracture: Secondary | ICD-10-CM | POA: Diagnosis not present

## 2022-06-23 DIAGNOSIS — M542 Cervicalgia: Secondary | ICD-10-CM | POA: Diagnosis not present

## 2022-06-23 DIAGNOSIS — Z043 Encounter for examination and observation following other accident: Secondary | ICD-10-CM | POA: Diagnosis not present

## 2022-06-23 DIAGNOSIS — W19XXXA Unspecified fall, initial encounter: Secondary | ICD-10-CM | POA: Diagnosis not present

## 2022-06-25 DIAGNOSIS — S12112D Nondisplaced Type II dens fracture, subsequent encounter for fracture with routine healing: Secondary | ICD-10-CM | POA: Diagnosis not present

## 2022-06-25 DIAGNOSIS — G8929 Other chronic pain: Secondary | ICD-10-CM | POA: Diagnosis not present

## 2022-06-25 DIAGNOSIS — R262 Difficulty in walking, not elsewhere classified: Secondary | ICD-10-CM | POA: Diagnosis not present

## 2022-06-25 DIAGNOSIS — I4891 Unspecified atrial fibrillation: Secondary | ICD-10-CM | POA: Diagnosis not present

## 2022-06-25 DIAGNOSIS — I6782 Cerebral ischemia: Secondary | ICD-10-CM | POA: Diagnosis not present

## 2022-06-25 DIAGNOSIS — S12112A Nondisplaced Type II dens fracture, initial encounter for closed fracture: Secondary | ICD-10-CM | POA: Diagnosis not present

## 2022-06-25 DIAGNOSIS — G894 Chronic pain syndrome: Secondary | ICD-10-CM | POA: Diagnosis not present

## 2022-06-25 DIAGNOSIS — R001 Bradycardia, unspecified: Secondary | ICD-10-CM | POA: Diagnosis not present

## 2022-06-25 DIAGNOSIS — R9089 Other abnormal findings on diagnostic imaging of central nervous system: Secondary | ICD-10-CM | POA: Diagnosis not present

## 2022-06-25 DIAGNOSIS — Z8781 Personal history of (healed) traumatic fracture: Secondary | ICD-10-CM | POA: Diagnosis not present

## 2022-06-25 DIAGNOSIS — E785 Hyperlipidemia, unspecified: Secondary | ICD-10-CM | POA: Diagnosis not present

## 2022-06-25 DIAGNOSIS — W19XXXA Unspecified fall, initial encounter: Secondary | ICD-10-CM | POA: Diagnosis not present

## 2022-06-25 DIAGNOSIS — R296 Repeated falls: Secondary | ICD-10-CM | POA: Diagnosis not present

## 2022-06-25 DIAGNOSIS — M6281 Muscle weakness (generalized): Secondary | ICD-10-CM | POA: Diagnosis not present

## 2022-06-25 DIAGNOSIS — Z79899 Other long term (current) drug therapy: Secondary | ICD-10-CM | POA: Diagnosis not present

## 2022-06-25 DIAGNOSIS — G309 Alzheimer's disease, unspecified: Secondary | ICD-10-CM | POA: Diagnosis not present

## 2022-06-25 DIAGNOSIS — Z7901 Long term (current) use of anticoagulants: Secondary | ICD-10-CM | POA: Diagnosis not present

## 2022-06-25 DIAGNOSIS — Z9181 History of falling: Secondary | ICD-10-CM | POA: Diagnosis not present

## 2022-06-25 DIAGNOSIS — I447 Left bundle-branch block, unspecified: Secondary | ICD-10-CM | POA: Diagnosis not present

## 2022-06-25 DIAGNOSIS — F0280 Dementia in other diseases classified elsewhere without behavioral disturbance: Secondary | ICD-10-CM | POA: Diagnosis not present

## 2022-06-25 DIAGNOSIS — M25561 Pain in right knee: Secondary | ICD-10-CM | POA: Diagnosis not present

## 2022-06-25 DIAGNOSIS — R4182 Altered mental status, unspecified: Secondary | ICD-10-CM | POA: Diagnosis not present

## 2022-06-25 DIAGNOSIS — M25562 Pain in left knee: Secondary | ICD-10-CM | POA: Diagnosis not present

## 2022-06-26 DIAGNOSIS — R262 Difficulty in walking, not elsewhere classified: Secondary | ICD-10-CM | POA: Diagnosis not present

## 2022-06-26 DIAGNOSIS — I447 Left bundle-branch block, unspecified: Secondary | ICD-10-CM | POA: Diagnosis not present

## 2022-06-26 DIAGNOSIS — Z743 Need for continuous supervision: Secondary | ICD-10-CM | POA: Diagnosis not present

## 2022-06-26 DIAGNOSIS — F0280 Dementia in other diseases classified elsewhere without behavioral disturbance: Secondary | ICD-10-CM | POA: Diagnosis not present

## 2022-06-26 DIAGNOSIS — Z9181 History of falling: Secondary | ICD-10-CM | POA: Diagnosis not present

## 2022-06-26 DIAGNOSIS — W19XXXA Unspecified fall, initial encounter: Secondary | ICD-10-CM | POA: Diagnosis not present

## 2022-06-26 DIAGNOSIS — R4182 Altered mental status, unspecified: Secondary | ICD-10-CM | POA: Diagnosis not present

## 2022-06-26 DIAGNOSIS — G309 Alzheimer's disease, unspecified: Secondary | ICD-10-CM | POA: Diagnosis not present

## 2022-06-26 DIAGNOSIS — Z8781 Personal history of (healed) traumatic fracture: Secondary | ICD-10-CM | POA: Diagnosis not present

## 2022-06-26 DIAGNOSIS — I4891 Unspecified atrial fibrillation: Secondary | ICD-10-CM | POA: Diagnosis not present

## 2022-06-28 DIAGNOSIS — S81012D Laceration without foreign body, left knee, subsequent encounter: Secondary | ICD-10-CM | POA: Diagnosis not present

## 2022-06-28 DIAGNOSIS — G8929 Other chronic pain: Secondary | ICD-10-CM | POA: Diagnosis not present

## 2022-06-28 DIAGNOSIS — Z87891 Personal history of nicotine dependence: Secondary | ICD-10-CM | POA: Diagnosis not present

## 2022-06-28 DIAGNOSIS — I4891 Unspecified atrial fibrillation: Secondary | ICD-10-CM | POA: Diagnosis not present

## 2022-06-28 DIAGNOSIS — M25561 Pain in right knee: Secondary | ICD-10-CM | POA: Diagnosis not present

## 2022-06-28 DIAGNOSIS — E785 Hyperlipidemia, unspecified: Secondary | ICD-10-CM | POA: Diagnosis not present

## 2022-06-28 DIAGNOSIS — G309 Alzheimer's disease, unspecified: Secondary | ICD-10-CM | POA: Diagnosis not present

## 2022-06-28 DIAGNOSIS — M109 Gout, unspecified: Secondary | ICD-10-CM | POA: Diagnosis not present

## 2022-06-28 DIAGNOSIS — I2510 Atherosclerotic heart disease of native coronary artery without angina pectoris: Secondary | ICD-10-CM | POA: Diagnosis not present

## 2022-06-28 DIAGNOSIS — Z9181 History of falling: Secondary | ICD-10-CM | POA: Diagnosis not present

## 2022-06-28 DIAGNOSIS — F0280 Dementia in other diseases classified elsewhere without behavioral disturbance: Secondary | ICD-10-CM | POA: Diagnosis not present

## 2022-06-28 DIAGNOSIS — S12112D Nondisplaced Type II dens fracture, subsequent encounter for fracture with routine healing: Secondary | ICD-10-CM | POA: Diagnosis not present

## 2022-06-28 DIAGNOSIS — Z556 Problems related to health literacy: Secondary | ICD-10-CM | POA: Diagnosis not present

## 2022-06-28 DIAGNOSIS — M25562 Pain in left knee: Secondary | ICD-10-CM | POA: Diagnosis not present

## 2022-07-01 DIAGNOSIS — F0280 Dementia in other diseases classified elsewhere without behavioral disturbance: Secondary | ICD-10-CM | POA: Diagnosis not present

## 2022-07-01 DIAGNOSIS — I4891 Unspecified atrial fibrillation: Secondary | ICD-10-CM | POA: Diagnosis not present

## 2022-07-01 DIAGNOSIS — S81012D Laceration without foreign body, left knee, subsequent encounter: Secondary | ICD-10-CM | POA: Diagnosis not present

## 2022-07-01 DIAGNOSIS — S12112D Nondisplaced Type II dens fracture, subsequent encounter for fracture with routine healing: Secondary | ICD-10-CM | POA: Diagnosis not present

## 2022-07-01 DIAGNOSIS — G309 Alzheimer's disease, unspecified: Secondary | ICD-10-CM | POA: Diagnosis not present

## 2022-07-01 DIAGNOSIS — E785 Hyperlipidemia, unspecified: Secondary | ICD-10-CM | POA: Diagnosis not present

## 2022-07-03 DIAGNOSIS — S12110A Anterior displaced Type II dens fracture, initial encounter for closed fracture: Secondary | ICD-10-CM | POA: Diagnosis not present

## 2022-07-04 DIAGNOSIS — G309 Alzheimer's disease, unspecified: Secondary | ICD-10-CM | POA: Diagnosis not present

## 2022-07-04 DIAGNOSIS — E785 Hyperlipidemia, unspecified: Secondary | ICD-10-CM | POA: Diagnosis not present

## 2022-07-04 DIAGNOSIS — R58 Hemorrhage, not elsewhere classified: Secondary | ICD-10-CM | POA: Diagnosis not present

## 2022-07-04 DIAGNOSIS — W19XXXA Unspecified fall, initial encounter: Secondary | ICD-10-CM | POA: Diagnosis not present

## 2022-07-04 DIAGNOSIS — S12112D Nondisplaced Type II dens fracture, subsequent encounter for fracture with routine healing: Secondary | ICD-10-CM | POA: Diagnosis not present

## 2022-07-04 DIAGNOSIS — S80211A Abrasion, right knee, initial encounter: Secondary | ICD-10-CM | POA: Diagnosis not present

## 2022-07-04 DIAGNOSIS — I4891 Unspecified atrial fibrillation: Secondary | ICD-10-CM | POA: Diagnosis not present

## 2022-07-04 DIAGNOSIS — Z743 Need for continuous supervision: Secondary | ICD-10-CM | POA: Diagnosis not present

## 2022-07-04 DIAGNOSIS — F0280 Dementia in other diseases classified elsewhere without behavioral disturbance: Secondary | ICD-10-CM | POA: Diagnosis not present

## 2022-07-04 DIAGNOSIS — R0902 Hypoxemia: Secondary | ICD-10-CM | POA: Diagnosis not present

## 2022-07-04 DIAGNOSIS — S81012D Laceration without foreign body, left knee, subsequent encounter: Secondary | ICD-10-CM | POA: Diagnosis not present

## 2022-07-08 DIAGNOSIS — I4891 Unspecified atrial fibrillation: Secondary | ICD-10-CM | POA: Diagnosis not present

## 2022-07-08 DIAGNOSIS — F0280 Dementia in other diseases classified elsewhere without behavioral disturbance: Secondary | ICD-10-CM | POA: Diagnosis not present

## 2022-07-08 DIAGNOSIS — E785 Hyperlipidemia, unspecified: Secondary | ICD-10-CM | POA: Diagnosis not present

## 2022-07-08 DIAGNOSIS — S81012D Laceration without foreign body, left knee, subsequent encounter: Secondary | ICD-10-CM | POA: Diagnosis not present

## 2022-07-08 DIAGNOSIS — G309 Alzheimer's disease, unspecified: Secondary | ICD-10-CM | POA: Diagnosis not present

## 2022-07-08 DIAGNOSIS — S12112D Nondisplaced Type II dens fracture, subsequent encounter for fracture with routine healing: Secondary | ICD-10-CM | POA: Diagnosis not present

## 2022-07-09 DIAGNOSIS — F0280 Dementia in other diseases classified elsewhere without behavioral disturbance: Secondary | ICD-10-CM | POA: Diagnosis not present

## 2022-07-09 DIAGNOSIS — I4891 Unspecified atrial fibrillation: Secondary | ICD-10-CM | POA: Diagnosis not present

## 2022-07-09 DIAGNOSIS — S12112D Nondisplaced Type II dens fracture, subsequent encounter for fracture with routine healing: Secondary | ICD-10-CM | POA: Diagnosis not present

## 2022-07-09 DIAGNOSIS — E785 Hyperlipidemia, unspecified: Secondary | ICD-10-CM | POA: Diagnosis not present

## 2022-07-09 DIAGNOSIS — S81012D Laceration without foreign body, left knee, subsequent encounter: Secondary | ICD-10-CM | POA: Diagnosis not present

## 2022-07-09 DIAGNOSIS — G309 Alzheimer's disease, unspecified: Secondary | ICD-10-CM | POA: Diagnosis not present

## 2022-07-10 DIAGNOSIS — E785 Hyperlipidemia, unspecified: Secondary | ICD-10-CM | POA: Diagnosis not present

## 2022-07-10 DIAGNOSIS — S12112D Nondisplaced Type II dens fracture, subsequent encounter for fracture with routine healing: Secondary | ICD-10-CM | POA: Diagnosis not present

## 2022-07-10 DIAGNOSIS — G309 Alzheimer's disease, unspecified: Secondary | ICD-10-CM | POA: Diagnosis not present

## 2022-07-10 DIAGNOSIS — F0280 Dementia in other diseases classified elsewhere without behavioral disturbance: Secondary | ICD-10-CM | POA: Diagnosis not present

## 2022-07-10 DIAGNOSIS — I4891 Unspecified atrial fibrillation: Secondary | ICD-10-CM | POA: Diagnosis not present

## 2022-07-10 DIAGNOSIS — S81012D Laceration without foreign body, left knee, subsequent encounter: Secondary | ICD-10-CM | POA: Diagnosis not present

## 2022-07-11 DIAGNOSIS — S12112D Nondisplaced Type II dens fracture, subsequent encounter for fracture with routine healing: Secondary | ICD-10-CM | POA: Diagnosis not present

## 2022-07-11 DIAGNOSIS — G309 Alzheimer's disease, unspecified: Secondary | ICD-10-CM | POA: Diagnosis not present

## 2022-07-11 DIAGNOSIS — I4891 Unspecified atrial fibrillation: Secondary | ICD-10-CM | POA: Diagnosis not present

## 2022-07-11 DIAGNOSIS — S81012D Laceration without foreign body, left knee, subsequent encounter: Secondary | ICD-10-CM | POA: Diagnosis not present

## 2022-07-11 DIAGNOSIS — E785 Hyperlipidemia, unspecified: Secondary | ICD-10-CM | POA: Diagnosis not present

## 2022-07-11 DIAGNOSIS — F0280 Dementia in other diseases classified elsewhere without behavioral disturbance: Secondary | ICD-10-CM | POA: Diagnosis not present

## 2022-07-14 DIAGNOSIS — E785 Hyperlipidemia, unspecified: Secondary | ICD-10-CM | POA: Diagnosis not present

## 2022-07-14 DIAGNOSIS — G309 Alzheimer's disease, unspecified: Secondary | ICD-10-CM | POA: Diagnosis not present

## 2022-07-14 DIAGNOSIS — S12112D Nondisplaced Type II dens fracture, subsequent encounter for fracture with routine healing: Secondary | ICD-10-CM | POA: Diagnosis not present

## 2022-07-14 DIAGNOSIS — S81012D Laceration without foreign body, left knee, subsequent encounter: Secondary | ICD-10-CM | POA: Diagnosis not present

## 2022-07-14 DIAGNOSIS — I4891 Unspecified atrial fibrillation: Secondary | ICD-10-CM | POA: Diagnosis not present

## 2022-07-14 DIAGNOSIS — F0280 Dementia in other diseases classified elsewhere without behavioral disturbance: Secondary | ICD-10-CM | POA: Diagnosis not present

## 2022-07-15 DIAGNOSIS — E785 Hyperlipidemia, unspecified: Secondary | ICD-10-CM | POA: Diagnosis not present

## 2022-07-15 DIAGNOSIS — S81012D Laceration without foreign body, left knee, subsequent encounter: Secondary | ICD-10-CM | POA: Diagnosis not present

## 2022-07-15 DIAGNOSIS — S12112D Nondisplaced Type II dens fracture, subsequent encounter for fracture with routine healing: Secondary | ICD-10-CM | POA: Diagnosis not present

## 2022-07-15 DIAGNOSIS — F0280 Dementia in other diseases classified elsewhere without behavioral disturbance: Secondary | ICD-10-CM | POA: Diagnosis not present

## 2022-07-15 DIAGNOSIS — G309 Alzheimer's disease, unspecified: Secondary | ICD-10-CM | POA: Diagnosis not present

## 2022-07-15 DIAGNOSIS — I4891 Unspecified atrial fibrillation: Secondary | ICD-10-CM | POA: Diagnosis not present

## 2022-07-22 DIAGNOSIS — I4891 Unspecified atrial fibrillation: Secondary | ICD-10-CM | POA: Diagnosis not present

## 2022-07-22 DIAGNOSIS — F0280 Dementia in other diseases classified elsewhere without behavioral disturbance: Secondary | ICD-10-CM | POA: Diagnosis not present

## 2022-07-22 DIAGNOSIS — E785 Hyperlipidemia, unspecified: Secondary | ICD-10-CM | POA: Diagnosis not present

## 2022-07-22 DIAGNOSIS — G309 Alzheimer's disease, unspecified: Secondary | ICD-10-CM | POA: Diagnosis not present

## 2022-07-22 DIAGNOSIS — S12112D Nondisplaced Type II dens fracture, subsequent encounter for fracture with routine healing: Secondary | ICD-10-CM | POA: Diagnosis not present

## 2022-07-22 DIAGNOSIS — S81012D Laceration without foreign body, left knee, subsequent encounter: Secondary | ICD-10-CM | POA: Diagnosis not present

## 2022-07-23 DIAGNOSIS — R109 Unspecified abdominal pain: Secondary | ICD-10-CM | POA: Diagnosis not present

## 2022-07-23 DIAGNOSIS — S81012D Laceration without foreign body, left knee, subsequent encounter: Secondary | ICD-10-CM | POA: Diagnosis not present

## 2022-07-23 DIAGNOSIS — G309 Alzheimer's disease, unspecified: Secondary | ICD-10-CM | POA: Diagnosis not present

## 2022-07-23 DIAGNOSIS — Z7401 Bed confinement status: Secondary | ICD-10-CM | POA: Diagnosis not present

## 2022-07-23 DIAGNOSIS — F0280 Dementia in other diseases classified elsewhere without behavioral disturbance: Secondary | ICD-10-CM | POA: Diagnosis not present

## 2022-07-23 DIAGNOSIS — E785 Hyperlipidemia, unspecified: Secondary | ICD-10-CM | POA: Diagnosis not present

## 2022-07-23 DIAGNOSIS — N400 Enlarged prostate without lower urinary tract symptoms: Secondary | ICD-10-CM | POA: Diagnosis not present

## 2022-07-23 DIAGNOSIS — K4090 Unilateral inguinal hernia, without obstruction or gangrene, not specified as recurrent: Secondary | ICD-10-CM | POA: Diagnosis not present

## 2022-07-23 DIAGNOSIS — K5730 Diverticulosis of large intestine without perforation or abscess without bleeding: Secondary | ICD-10-CM | POA: Diagnosis not present

## 2022-07-23 DIAGNOSIS — R404 Transient alteration of awareness: Secondary | ICD-10-CM | POA: Diagnosis not present

## 2022-07-23 DIAGNOSIS — I4891 Unspecified atrial fibrillation: Secondary | ICD-10-CM | POA: Diagnosis not present

## 2022-07-23 DIAGNOSIS — Z79899 Other long term (current) drug therapy: Secondary | ICD-10-CM | POA: Diagnosis not present

## 2022-07-23 DIAGNOSIS — R1084 Generalized abdominal pain: Secondary | ICD-10-CM | POA: Diagnosis not present

## 2022-07-23 DIAGNOSIS — S12112D Nondisplaced Type II dens fracture, subsequent encounter for fracture with routine healing: Secondary | ICD-10-CM | POA: Diagnosis not present

## 2022-07-23 DIAGNOSIS — K5901 Slow transit constipation: Secondary | ICD-10-CM | POA: Diagnosis not present

## 2022-07-28 DIAGNOSIS — G309 Alzheimer's disease, unspecified: Secondary | ICD-10-CM | POA: Diagnosis not present

## 2022-07-28 DIAGNOSIS — G8929 Other chronic pain: Secondary | ICD-10-CM | POA: Diagnosis not present

## 2022-07-28 DIAGNOSIS — F0280 Dementia in other diseases classified elsewhere without behavioral disturbance: Secondary | ICD-10-CM | POA: Diagnosis not present

## 2022-07-28 DIAGNOSIS — I4891 Unspecified atrial fibrillation: Secondary | ICD-10-CM | POA: Diagnosis not present

## 2022-07-28 DIAGNOSIS — M25562 Pain in left knee: Secondary | ICD-10-CM | POA: Diagnosis not present

## 2022-07-28 DIAGNOSIS — S81012D Laceration without foreign body, left knee, subsequent encounter: Secondary | ICD-10-CM | POA: Diagnosis not present

## 2022-07-28 DIAGNOSIS — M109 Gout, unspecified: Secondary | ICD-10-CM | POA: Diagnosis not present

## 2022-07-28 DIAGNOSIS — S12112D Nondisplaced Type II dens fracture, subsequent encounter for fracture with routine healing: Secondary | ICD-10-CM | POA: Diagnosis not present

## 2022-07-28 DIAGNOSIS — M25561 Pain in right knee: Secondary | ICD-10-CM | POA: Diagnosis not present

## 2022-07-28 DIAGNOSIS — Z87891 Personal history of nicotine dependence: Secondary | ICD-10-CM | POA: Diagnosis not present

## 2022-07-28 DIAGNOSIS — Z556 Problems related to health literacy: Secondary | ICD-10-CM | POA: Diagnosis not present

## 2022-07-28 DIAGNOSIS — Z9181 History of falling: Secondary | ICD-10-CM | POA: Diagnosis not present

## 2022-07-28 DIAGNOSIS — I2510 Atherosclerotic heart disease of native coronary artery without angina pectoris: Secondary | ICD-10-CM | POA: Diagnosis not present

## 2022-07-28 DIAGNOSIS — E785 Hyperlipidemia, unspecified: Secondary | ICD-10-CM | POA: Diagnosis not present

## 2022-07-29 DIAGNOSIS — I4891 Unspecified atrial fibrillation: Secondary | ICD-10-CM | POA: Diagnosis not present

## 2022-07-29 DIAGNOSIS — G309 Alzheimer's disease, unspecified: Secondary | ICD-10-CM | POA: Diagnosis not present

## 2022-07-29 DIAGNOSIS — S12112D Nondisplaced Type II dens fracture, subsequent encounter for fracture with routine healing: Secondary | ICD-10-CM | POA: Diagnosis not present

## 2022-07-29 DIAGNOSIS — E785 Hyperlipidemia, unspecified: Secondary | ICD-10-CM | POA: Diagnosis not present

## 2022-07-29 DIAGNOSIS — F0280 Dementia in other diseases classified elsewhere without behavioral disturbance: Secondary | ICD-10-CM | POA: Diagnosis not present

## 2022-07-29 DIAGNOSIS — S81012D Laceration without foreign body, left knee, subsequent encounter: Secondary | ICD-10-CM | POA: Diagnosis not present

## 2022-07-31 DIAGNOSIS — E785 Hyperlipidemia, unspecified: Secondary | ICD-10-CM | POA: Diagnosis not present

## 2022-07-31 DIAGNOSIS — F0280 Dementia in other diseases classified elsewhere without behavioral disturbance: Secondary | ICD-10-CM | POA: Diagnosis not present

## 2022-07-31 DIAGNOSIS — S12112D Nondisplaced Type II dens fracture, subsequent encounter for fracture with routine healing: Secondary | ICD-10-CM | POA: Diagnosis not present

## 2022-07-31 DIAGNOSIS — G309 Alzheimer's disease, unspecified: Secondary | ICD-10-CM | POA: Diagnosis not present

## 2022-07-31 DIAGNOSIS — I4891 Unspecified atrial fibrillation: Secondary | ICD-10-CM | POA: Diagnosis not present

## 2022-07-31 DIAGNOSIS — S81012D Laceration without foreign body, left knee, subsequent encounter: Secondary | ICD-10-CM | POA: Diagnosis not present

## 2022-08-01 DIAGNOSIS — G309 Alzheimer's disease, unspecified: Secondary | ICD-10-CM | POA: Diagnosis not present

## 2022-08-01 DIAGNOSIS — S81012D Laceration without foreign body, left knee, subsequent encounter: Secondary | ICD-10-CM | POA: Diagnosis not present

## 2022-08-01 DIAGNOSIS — F0280 Dementia in other diseases classified elsewhere without behavioral disturbance: Secondary | ICD-10-CM | POA: Diagnosis not present

## 2022-08-01 DIAGNOSIS — S12112D Nondisplaced Type II dens fracture, subsequent encounter for fracture with routine healing: Secondary | ICD-10-CM | POA: Diagnosis not present

## 2022-08-01 DIAGNOSIS — I4891 Unspecified atrial fibrillation: Secondary | ICD-10-CM | POA: Diagnosis not present

## 2022-08-01 DIAGNOSIS — E785 Hyperlipidemia, unspecified: Secondary | ICD-10-CM | POA: Diagnosis not present

## 2022-08-04 DIAGNOSIS — S81012D Laceration without foreign body, left knee, subsequent encounter: Secondary | ICD-10-CM | POA: Diagnosis not present

## 2022-08-04 DIAGNOSIS — E785 Hyperlipidemia, unspecified: Secondary | ICD-10-CM | POA: Diagnosis not present

## 2022-08-04 DIAGNOSIS — F0280 Dementia in other diseases classified elsewhere without behavioral disturbance: Secondary | ICD-10-CM | POA: Diagnosis not present

## 2022-08-04 DIAGNOSIS — S12112D Nondisplaced Type II dens fracture, subsequent encounter for fracture with routine healing: Secondary | ICD-10-CM | POA: Diagnosis not present

## 2022-08-04 DIAGNOSIS — G309 Alzheimer's disease, unspecified: Secondary | ICD-10-CM | POA: Diagnosis not present

## 2022-08-04 DIAGNOSIS — I4891 Unspecified atrial fibrillation: Secondary | ICD-10-CM | POA: Diagnosis not present

## 2022-08-06 DIAGNOSIS — E785 Hyperlipidemia, unspecified: Secondary | ICD-10-CM | POA: Diagnosis not present

## 2022-08-06 DIAGNOSIS — S81012D Laceration without foreign body, left knee, subsequent encounter: Secondary | ICD-10-CM | POA: Diagnosis not present

## 2022-08-06 DIAGNOSIS — G309 Alzheimer's disease, unspecified: Secondary | ICD-10-CM | POA: Diagnosis not present

## 2022-08-06 DIAGNOSIS — S12112D Nondisplaced Type II dens fracture, subsequent encounter for fracture with routine healing: Secondary | ICD-10-CM | POA: Diagnosis not present

## 2022-08-06 DIAGNOSIS — F0280 Dementia in other diseases classified elsewhere without behavioral disturbance: Secondary | ICD-10-CM | POA: Diagnosis not present

## 2022-08-06 DIAGNOSIS — I4891 Unspecified atrial fibrillation: Secondary | ICD-10-CM | POA: Diagnosis not present

## 2022-08-09 DIAGNOSIS — F0280 Dementia in other diseases classified elsewhere without behavioral disturbance: Secondary | ICD-10-CM | POA: Diagnosis not present

## 2022-08-09 DIAGNOSIS — M109 Gout, unspecified: Secondary | ICD-10-CM | POA: Diagnosis not present

## 2022-08-09 DIAGNOSIS — G309 Alzheimer's disease, unspecified: Secondary | ICD-10-CM | POA: Diagnosis not present

## 2022-08-09 DIAGNOSIS — S12112D Nondisplaced Type II dens fracture, subsequent encounter for fracture with routine healing: Secondary | ICD-10-CM | POA: Diagnosis not present

## 2022-08-09 DIAGNOSIS — I4891 Unspecified atrial fibrillation: Secondary | ICD-10-CM | POA: Diagnosis not present

## 2022-08-09 DIAGNOSIS — S81012D Laceration without foreign body, left knee, subsequent encounter: Secondary | ICD-10-CM | POA: Diagnosis not present

## 2022-08-09 DIAGNOSIS — E785 Hyperlipidemia, unspecified: Secondary | ICD-10-CM | POA: Diagnosis not present

## 2022-08-12 DIAGNOSIS — G309 Alzheimer's disease, unspecified: Secondary | ICD-10-CM | POA: Diagnosis not present

## 2022-08-12 DIAGNOSIS — F0280 Dementia in other diseases classified elsewhere without behavioral disturbance: Secondary | ICD-10-CM | POA: Diagnosis not present

## 2022-08-12 DIAGNOSIS — E785 Hyperlipidemia, unspecified: Secondary | ICD-10-CM | POA: Diagnosis not present

## 2022-08-12 DIAGNOSIS — S12112D Nondisplaced Type II dens fracture, subsequent encounter for fracture with routine healing: Secondary | ICD-10-CM | POA: Diagnosis not present

## 2022-08-12 DIAGNOSIS — S81012D Laceration without foreign body, left knee, subsequent encounter: Secondary | ICD-10-CM | POA: Diagnosis not present

## 2022-08-12 DIAGNOSIS — I4891 Unspecified atrial fibrillation: Secondary | ICD-10-CM | POA: Diagnosis not present

## 2022-08-12 DIAGNOSIS — M109 Gout, unspecified: Secondary | ICD-10-CM | POA: Diagnosis not present

## 2022-08-19 DIAGNOSIS — M25461 Effusion, right knee: Secondary | ICD-10-CM | POA: Diagnosis not present

## 2022-08-19 DIAGNOSIS — Z743 Need for continuous supervision: Secondary | ICD-10-CM | POA: Diagnosis not present

## 2022-08-19 DIAGNOSIS — R279 Unspecified lack of coordination: Secondary | ICD-10-CM | POA: Diagnosis not present

## 2022-08-22 DIAGNOSIS — F0280 Dementia in other diseases classified elsewhere without behavioral disturbance: Secondary | ICD-10-CM | POA: Diagnosis not present

## 2022-08-22 DIAGNOSIS — F0390 Unspecified dementia without behavioral disturbance: Secondary | ICD-10-CM | POA: Diagnosis not present

## 2022-08-22 DIAGNOSIS — R000 Tachycardia, unspecified: Secondary | ICD-10-CM | POA: Diagnosis not present

## 2022-08-22 DIAGNOSIS — R700 Elevated erythrocyte sedimentation rate: Secondary | ICD-10-CM | POA: Diagnosis not present

## 2022-08-22 DIAGNOSIS — Z1152 Encounter for screening for COVID-19: Secondary | ICD-10-CM | POA: Diagnosis not present

## 2022-08-22 DIAGNOSIS — S81012D Laceration without foreign body, left knee, subsequent encounter: Secondary | ICD-10-CM | POA: Diagnosis not present

## 2022-08-22 DIAGNOSIS — S12112D Nondisplaced Type II dens fracture, subsequent encounter for fracture with routine healing: Secondary | ICD-10-CM | POA: Diagnosis not present

## 2022-08-22 DIAGNOSIS — L03115 Cellulitis of right lower limb: Secondary | ICD-10-CM | POA: Diagnosis not present

## 2022-08-22 DIAGNOSIS — E785 Hyperlipidemia, unspecified: Secondary | ICD-10-CM | POA: Diagnosis not present

## 2022-08-22 DIAGNOSIS — R9431 Abnormal electrocardiogram [ECG] [EKG]: Secondary | ICD-10-CM | POA: Diagnosis not present

## 2022-08-22 DIAGNOSIS — M7989 Other specified soft tissue disorders: Secondary | ICD-10-CM | POA: Diagnosis not present

## 2022-08-22 DIAGNOSIS — R7982 Elevated C-reactive protein (CRP): Secondary | ICD-10-CM | POA: Diagnosis not present

## 2022-08-22 DIAGNOSIS — R609 Edema, unspecified: Secondary | ICD-10-CM | POA: Diagnosis not present

## 2022-08-22 DIAGNOSIS — R7401 Elevation of levels of liver transaminase levels: Secondary | ICD-10-CM | POA: Diagnosis not present

## 2022-08-22 DIAGNOSIS — M25461 Effusion, right knee: Secondary | ICD-10-CM | POA: Diagnosis not present

## 2022-08-22 DIAGNOSIS — I4891 Unspecified atrial fibrillation: Secondary | ICD-10-CM | POA: Diagnosis not present

## 2022-08-22 DIAGNOSIS — Z7401 Bed confinement status: Secondary | ICD-10-CM | POA: Diagnosis not present

## 2022-08-22 DIAGNOSIS — R4182 Altered mental status, unspecified: Secondary | ICD-10-CM | POA: Diagnosis not present

## 2022-08-22 DIAGNOSIS — Z7901 Long term (current) use of anticoagulants: Secondary | ICD-10-CM | POA: Diagnosis not present

## 2022-08-22 DIAGNOSIS — M25561 Pain in right knee: Secondary | ICD-10-CM | POA: Diagnosis not present

## 2022-08-22 DIAGNOSIS — G309 Alzheimer's disease, unspecified: Secondary | ICD-10-CM | POA: Diagnosis not present

## 2022-08-23 DIAGNOSIS — I4891 Unspecified atrial fibrillation: Secondary | ICD-10-CM | POA: Diagnosis not present

## 2022-08-23 DIAGNOSIS — G309 Alzheimer's disease, unspecified: Secondary | ICD-10-CM | POA: Diagnosis not present

## 2022-08-23 DIAGNOSIS — S81012D Laceration without foreign body, left knee, subsequent encounter: Secondary | ICD-10-CM | POA: Diagnosis not present

## 2022-08-23 DIAGNOSIS — S12112D Nondisplaced Type II dens fracture, subsequent encounter for fracture with routine healing: Secondary | ICD-10-CM | POA: Diagnosis not present

## 2022-08-23 DIAGNOSIS — E785 Hyperlipidemia, unspecified: Secondary | ICD-10-CM | POA: Diagnosis not present

## 2022-08-23 DIAGNOSIS — F0280 Dementia in other diseases classified elsewhere without behavioral disturbance: Secondary | ICD-10-CM | POA: Diagnosis not present

## 2022-08-27 DIAGNOSIS — Z9181 History of falling: Secondary | ICD-10-CM | POA: Diagnosis not present

## 2022-08-27 DIAGNOSIS — N3281 Overactive bladder: Secondary | ICD-10-CM | POA: Diagnosis not present

## 2022-08-27 DIAGNOSIS — G301 Alzheimer's disease with late onset: Secondary | ICD-10-CM | POA: Diagnosis not present

## 2022-08-27 DIAGNOSIS — Z8616 Personal history of COVID-19: Secondary | ICD-10-CM | POA: Diagnosis not present

## 2022-08-27 DIAGNOSIS — I4891 Unspecified atrial fibrillation: Secondary | ICD-10-CM | POA: Diagnosis not present

## 2022-08-27 DIAGNOSIS — S12112D Nondisplaced Type II dens fracture, subsequent encounter for fracture with routine healing: Secondary | ICD-10-CM | POA: Diagnosis not present

## 2022-08-27 DIAGNOSIS — Z87891 Personal history of nicotine dependence: Secondary | ICD-10-CM | POA: Diagnosis not present

## 2022-08-27 DIAGNOSIS — M170 Bilateral primary osteoarthritis of knee: Secondary | ICD-10-CM | POA: Diagnosis not present

## 2022-08-27 DIAGNOSIS — I2510 Atherosclerotic heart disease of native coronary artery without angina pectoris: Secondary | ICD-10-CM | POA: Diagnosis not present

## 2022-08-27 DIAGNOSIS — F02811 Dementia in other diseases classified elsewhere, unspecified severity, with agitation: Secondary | ICD-10-CM | POA: Diagnosis not present

## 2022-08-27 DIAGNOSIS — Z8701 Personal history of pneumonia (recurrent): Secondary | ICD-10-CM | POA: Diagnosis not present

## 2022-08-27 DIAGNOSIS — F0283 Dementia in other diseases classified elsewhere, unspecified severity, with mood disturbance: Secondary | ICD-10-CM | POA: Diagnosis not present

## 2022-08-27 DIAGNOSIS — M109 Gout, unspecified: Secondary | ICD-10-CM | POA: Diagnosis not present

## 2022-08-28 DIAGNOSIS — Z8616 Personal history of COVID-19: Secondary | ICD-10-CM | POA: Diagnosis not present

## 2022-08-28 DIAGNOSIS — F02811 Dementia in other diseases classified elsewhere, unspecified severity, with agitation: Secondary | ICD-10-CM | POA: Diagnosis not present

## 2022-08-28 DIAGNOSIS — F0283 Dementia in other diseases classified elsewhere, unspecified severity, with mood disturbance: Secondary | ICD-10-CM | POA: Diagnosis not present

## 2022-08-28 DIAGNOSIS — S12112D Nondisplaced Type II dens fracture, subsequent encounter for fracture with routine healing: Secondary | ICD-10-CM | POA: Diagnosis not present

## 2022-08-28 DIAGNOSIS — G301 Alzheimer's disease with late onset: Secondary | ICD-10-CM | POA: Diagnosis not present

## 2022-08-28 DIAGNOSIS — Z9181 History of falling: Secondary | ICD-10-CM | POA: Diagnosis not present

## 2022-08-29 DIAGNOSIS — F02811 Dementia in other diseases classified elsewhere, unspecified severity, with agitation: Secondary | ICD-10-CM | POA: Diagnosis not present

## 2022-08-29 DIAGNOSIS — S12112D Nondisplaced Type II dens fracture, subsequent encounter for fracture with routine healing: Secondary | ICD-10-CM | POA: Diagnosis not present

## 2022-08-29 DIAGNOSIS — Z9181 History of falling: Secondary | ICD-10-CM | POA: Diagnosis not present

## 2022-08-29 DIAGNOSIS — F0283 Dementia in other diseases classified elsewhere, unspecified severity, with mood disturbance: Secondary | ICD-10-CM | POA: Diagnosis not present

## 2022-08-29 DIAGNOSIS — Z8616 Personal history of COVID-19: Secondary | ICD-10-CM | POA: Diagnosis not present

## 2022-08-29 DIAGNOSIS — G301 Alzheimer's disease with late onset: Secondary | ICD-10-CM | POA: Diagnosis not present

## 2022-09-01 DIAGNOSIS — G301 Alzheimer's disease with late onset: Secondary | ICD-10-CM | POA: Diagnosis not present

## 2022-09-01 DIAGNOSIS — Z8616 Personal history of COVID-19: Secondary | ICD-10-CM | POA: Diagnosis not present

## 2022-09-01 DIAGNOSIS — F02811 Dementia in other diseases classified elsewhere, unspecified severity, with agitation: Secondary | ICD-10-CM | POA: Diagnosis not present

## 2022-09-01 DIAGNOSIS — S12112D Nondisplaced Type II dens fracture, subsequent encounter for fracture with routine healing: Secondary | ICD-10-CM | POA: Diagnosis not present

## 2022-09-01 DIAGNOSIS — Z9181 History of falling: Secondary | ICD-10-CM | POA: Diagnosis not present

## 2022-09-01 DIAGNOSIS — F0283 Dementia in other diseases classified elsewhere, unspecified severity, with mood disturbance: Secondary | ICD-10-CM | POA: Diagnosis not present

## 2022-09-02 ENCOUNTER — Ambulatory Visit: Payer: Medicare Other | Admitting: Cardiology

## 2022-09-04 DIAGNOSIS — S12112D Nondisplaced Type II dens fracture, subsequent encounter for fracture with routine healing: Secondary | ICD-10-CM | POA: Diagnosis not present

## 2022-09-04 DIAGNOSIS — Z8616 Personal history of COVID-19: Secondary | ICD-10-CM | POA: Diagnosis not present

## 2022-09-04 DIAGNOSIS — Z9181 History of falling: Secondary | ICD-10-CM | POA: Diagnosis not present

## 2022-09-04 DIAGNOSIS — F0283 Dementia in other diseases classified elsewhere, unspecified severity, with mood disturbance: Secondary | ICD-10-CM | POA: Diagnosis not present

## 2022-09-04 DIAGNOSIS — G301 Alzheimer's disease with late onset: Secondary | ICD-10-CM | POA: Diagnosis not present

## 2022-09-04 DIAGNOSIS — F02811 Dementia in other diseases classified elsewhere, unspecified severity, with agitation: Secondary | ICD-10-CM | POA: Diagnosis not present

## 2022-09-09 DIAGNOSIS — F02811 Dementia in other diseases classified elsewhere, unspecified severity, with agitation: Secondary | ICD-10-CM | POA: Diagnosis not present

## 2022-09-09 DIAGNOSIS — S12112D Nondisplaced Type II dens fracture, subsequent encounter for fracture with routine healing: Secondary | ICD-10-CM | POA: Diagnosis not present

## 2022-09-09 DIAGNOSIS — G301 Alzheimer's disease with late onset: Secondary | ICD-10-CM | POA: Diagnosis not present

## 2022-09-09 DIAGNOSIS — Z9181 History of falling: Secondary | ICD-10-CM | POA: Diagnosis not present

## 2022-09-09 DIAGNOSIS — Z8616 Personal history of COVID-19: Secondary | ICD-10-CM | POA: Diagnosis not present

## 2022-09-09 DIAGNOSIS — F0283 Dementia in other diseases classified elsewhere, unspecified severity, with mood disturbance: Secondary | ICD-10-CM | POA: Diagnosis not present

## 2022-09-14 DIAGNOSIS — N3281 Overactive bladder: Secondary | ICD-10-CM | POA: Diagnosis not present

## 2022-09-14 DIAGNOSIS — F0283 Dementia in other diseases classified elsewhere, unspecified severity, with mood disturbance: Secondary | ICD-10-CM | POA: Diagnosis not present

## 2022-09-14 DIAGNOSIS — I4891 Unspecified atrial fibrillation: Secondary | ICD-10-CM | POA: Diagnosis not present

## 2022-09-14 DIAGNOSIS — S12112D Nondisplaced Type II dens fracture, subsequent encounter for fracture with routine healing: Secondary | ICD-10-CM | POA: Diagnosis not present

## 2022-09-14 DIAGNOSIS — M109 Gout, unspecified: Secondary | ICD-10-CM | POA: Diagnosis not present

## 2022-09-14 DIAGNOSIS — G301 Alzheimer's disease with late onset: Secondary | ICD-10-CM | POA: Diagnosis not present

## 2022-09-14 DIAGNOSIS — F02811 Dementia in other diseases classified elsewhere, unspecified severity, with agitation: Secondary | ICD-10-CM | POA: Diagnosis not present

## 2022-09-14 DIAGNOSIS — Z87891 Personal history of nicotine dependence: Secondary | ICD-10-CM | POA: Diagnosis not present

## 2022-09-14 DIAGNOSIS — I2510 Atherosclerotic heart disease of native coronary artery without angina pectoris: Secondary | ICD-10-CM | POA: Diagnosis not present

## 2022-09-14 DIAGNOSIS — M170 Bilateral primary osteoarthritis of knee: Secondary | ICD-10-CM | POA: Diagnosis not present

## 2022-09-14 DIAGNOSIS — Z8701 Personal history of pneumonia (recurrent): Secondary | ICD-10-CM | POA: Diagnosis not present

## 2022-09-14 DIAGNOSIS — Z8616 Personal history of COVID-19: Secondary | ICD-10-CM | POA: Diagnosis not present

## 2022-09-14 DIAGNOSIS — Z9181 History of falling: Secondary | ICD-10-CM | POA: Diagnosis not present

## 2022-09-16 DIAGNOSIS — F02811 Dementia in other diseases classified elsewhere, unspecified severity, with agitation: Secondary | ICD-10-CM | POA: Diagnosis not present

## 2022-09-16 DIAGNOSIS — Z9181 History of falling: Secondary | ICD-10-CM | POA: Diagnosis not present

## 2022-09-16 DIAGNOSIS — Z8616 Personal history of COVID-19: Secondary | ICD-10-CM | POA: Diagnosis not present

## 2022-09-16 DIAGNOSIS — G301 Alzheimer's disease with late onset: Secondary | ICD-10-CM | POA: Diagnosis not present

## 2022-09-16 DIAGNOSIS — F0283 Dementia in other diseases classified elsewhere, unspecified severity, with mood disturbance: Secondary | ICD-10-CM | POA: Diagnosis not present

## 2022-09-16 DIAGNOSIS — S12112D Nondisplaced Type II dens fracture, subsequent encounter for fracture with routine healing: Secondary | ICD-10-CM | POA: Diagnosis not present

## 2022-09-20 DIAGNOSIS — G301 Alzheimer's disease with late onset: Secondary | ICD-10-CM | POA: Diagnosis not present

## 2022-09-20 DIAGNOSIS — Z8616 Personal history of COVID-19: Secondary | ICD-10-CM | POA: Diagnosis not present

## 2022-09-20 DIAGNOSIS — S12112D Nondisplaced Type II dens fracture, subsequent encounter for fracture with routine healing: Secondary | ICD-10-CM | POA: Diagnosis not present

## 2022-09-20 DIAGNOSIS — F02811 Dementia in other diseases classified elsewhere, unspecified severity, with agitation: Secondary | ICD-10-CM | POA: Diagnosis not present

## 2022-09-20 DIAGNOSIS — F0283 Dementia in other diseases classified elsewhere, unspecified severity, with mood disturbance: Secondary | ICD-10-CM | POA: Diagnosis not present

## 2022-09-20 DIAGNOSIS — Z9181 History of falling: Secondary | ICD-10-CM | POA: Diagnosis not present

## 2022-09-22 DIAGNOSIS — F02811 Dementia in other diseases classified elsewhere, unspecified severity, with agitation: Secondary | ICD-10-CM | POA: Diagnosis not present

## 2022-09-22 DIAGNOSIS — Z8616 Personal history of COVID-19: Secondary | ICD-10-CM | POA: Diagnosis not present

## 2022-09-22 DIAGNOSIS — F0283 Dementia in other diseases classified elsewhere, unspecified severity, with mood disturbance: Secondary | ICD-10-CM | POA: Diagnosis not present

## 2022-09-22 DIAGNOSIS — S12112D Nondisplaced Type II dens fracture, subsequent encounter for fracture with routine healing: Secondary | ICD-10-CM | POA: Diagnosis not present

## 2022-09-22 DIAGNOSIS — G301 Alzheimer's disease with late onset: Secondary | ICD-10-CM | POA: Diagnosis not present

## 2022-09-22 DIAGNOSIS — Z9181 History of falling: Secondary | ICD-10-CM | POA: Diagnosis not present

## 2022-09-26 DIAGNOSIS — G301 Alzheimer's disease with late onset: Secondary | ICD-10-CM | POA: Diagnosis not present

## 2022-09-26 DIAGNOSIS — F02811 Dementia in other diseases classified elsewhere, unspecified severity, with agitation: Secondary | ICD-10-CM | POA: Diagnosis not present

## 2022-09-26 DIAGNOSIS — S12112D Nondisplaced Type II dens fracture, subsequent encounter for fracture with routine healing: Secondary | ICD-10-CM | POA: Diagnosis not present

## 2022-09-26 DIAGNOSIS — Z8616 Personal history of COVID-19: Secondary | ICD-10-CM | POA: Diagnosis not present

## 2022-09-26 DIAGNOSIS — F0283 Dementia in other diseases classified elsewhere, unspecified severity, with mood disturbance: Secondary | ICD-10-CM | POA: Diagnosis not present

## 2022-09-26 DIAGNOSIS — Z9181 History of falling: Secondary | ICD-10-CM | POA: Diagnosis not present

## 2022-09-29 DIAGNOSIS — G301 Alzheimer's disease with late onset: Secondary | ICD-10-CM | POA: Diagnosis not present

## 2022-09-29 DIAGNOSIS — F02811 Dementia in other diseases classified elsewhere, unspecified severity, with agitation: Secondary | ICD-10-CM | POA: Diagnosis not present

## 2022-09-29 DIAGNOSIS — F0283 Dementia in other diseases classified elsewhere, unspecified severity, with mood disturbance: Secondary | ICD-10-CM | POA: Diagnosis not present

## 2022-09-29 DIAGNOSIS — Z8616 Personal history of COVID-19: Secondary | ICD-10-CM | POA: Diagnosis not present

## 2022-09-29 DIAGNOSIS — Z9181 History of falling: Secondary | ICD-10-CM | POA: Diagnosis not present

## 2022-09-29 DIAGNOSIS — S12112D Nondisplaced Type II dens fracture, subsequent encounter for fracture with routine healing: Secondary | ICD-10-CM | POA: Diagnosis not present

## 2022-09-30 DIAGNOSIS — Z8616 Personal history of COVID-19: Secondary | ICD-10-CM | POA: Diagnosis not present

## 2022-09-30 DIAGNOSIS — G301 Alzheimer's disease with late onset: Secondary | ICD-10-CM | POA: Diagnosis not present

## 2022-09-30 DIAGNOSIS — F0283 Dementia in other diseases classified elsewhere, unspecified severity, with mood disturbance: Secondary | ICD-10-CM | POA: Diagnosis not present

## 2022-09-30 DIAGNOSIS — F02811 Dementia in other diseases classified elsewhere, unspecified severity, with agitation: Secondary | ICD-10-CM | POA: Diagnosis not present

## 2022-09-30 DIAGNOSIS — S12112D Nondisplaced Type II dens fracture, subsequent encounter for fracture with routine healing: Secondary | ICD-10-CM | POA: Diagnosis not present

## 2022-09-30 DIAGNOSIS — Z9181 History of falling: Secondary | ICD-10-CM | POA: Diagnosis not present

## 2022-10-03 DIAGNOSIS — F02811 Dementia in other diseases classified elsewhere, unspecified severity, with agitation: Secondary | ICD-10-CM | POA: Diagnosis not present

## 2022-10-03 DIAGNOSIS — Z9181 History of falling: Secondary | ICD-10-CM | POA: Diagnosis not present

## 2022-10-03 DIAGNOSIS — Z8616 Personal history of COVID-19: Secondary | ICD-10-CM | POA: Diagnosis not present

## 2022-10-03 DIAGNOSIS — F0283 Dementia in other diseases classified elsewhere, unspecified severity, with mood disturbance: Secondary | ICD-10-CM | POA: Diagnosis not present

## 2022-10-03 DIAGNOSIS — G301 Alzheimer's disease with late onset: Secondary | ICD-10-CM | POA: Diagnosis not present

## 2022-10-03 DIAGNOSIS — S12112D Nondisplaced Type II dens fracture, subsequent encounter for fracture with routine healing: Secondary | ICD-10-CM | POA: Diagnosis not present

## 2022-10-06 DIAGNOSIS — S12112D Nondisplaced Type II dens fracture, subsequent encounter for fracture with routine healing: Secondary | ICD-10-CM | POA: Diagnosis not present

## 2022-10-06 DIAGNOSIS — G301 Alzheimer's disease with late onset: Secondary | ICD-10-CM | POA: Diagnosis not present

## 2022-10-06 DIAGNOSIS — Z9181 History of falling: Secondary | ICD-10-CM | POA: Diagnosis not present

## 2022-10-06 DIAGNOSIS — Z8616 Personal history of COVID-19: Secondary | ICD-10-CM | POA: Diagnosis not present

## 2022-10-06 DIAGNOSIS — F0283 Dementia in other diseases classified elsewhere, unspecified severity, with mood disturbance: Secondary | ICD-10-CM | POA: Diagnosis not present

## 2022-10-06 DIAGNOSIS — F02811 Dementia in other diseases classified elsewhere, unspecified severity, with agitation: Secondary | ICD-10-CM | POA: Diagnosis not present

## 2022-10-07 DIAGNOSIS — F0283 Dementia in other diseases classified elsewhere, unspecified severity, with mood disturbance: Secondary | ICD-10-CM | POA: Diagnosis not present

## 2022-10-07 DIAGNOSIS — Z9181 History of falling: Secondary | ICD-10-CM | POA: Diagnosis not present

## 2022-10-07 DIAGNOSIS — S12112D Nondisplaced Type II dens fracture, subsequent encounter for fracture with routine healing: Secondary | ICD-10-CM | POA: Diagnosis not present

## 2022-10-07 DIAGNOSIS — G301 Alzheimer's disease with late onset: Secondary | ICD-10-CM | POA: Diagnosis not present

## 2022-10-07 DIAGNOSIS — Z8616 Personal history of COVID-19: Secondary | ICD-10-CM | POA: Diagnosis not present

## 2022-10-07 DIAGNOSIS — F02811 Dementia in other diseases classified elsewhere, unspecified severity, with agitation: Secondary | ICD-10-CM | POA: Diagnosis not present

## 2022-10-08 DIAGNOSIS — Z8616 Personal history of COVID-19: Secondary | ICD-10-CM | POA: Diagnosis not present

## 2022-10-08 DIAGNOSIS — Z9181 History of falling: Secondary | ICD-10-CM | POA: Diagnosis not present

## 2022-10-08 DIAGNOSIS — S12112D Nondisplaced Type II dens fracture, subsequent encounter for fracture with routine healing: Secondary | ICD-10-CM | POA: Diagnosis not present

## 2022-10-08 DIAGNOSIS — F0283 Dementia in other diseases classified elsewhere, unspecified severity, with mood disturbance: Secondary | ICD-10-CM | POA: Diagnosis not present

## 2022-10-08 DIAGNOSIS — G301 Alzheimer's disease with late onset: Secondary | ICD-10-CM | POA: Diagnosis not present

## 2022-10-08 DIAGNOSIS — F02811 Dementia in other diseases classified elsewhere, unspecified severity, with agitation: Secondary | ICD-10-CM | POA: Diagnosis not present

## 2022-10-09 DIAGNOSIS — Z9181 History of falling: Secondary | ICD-10-CM | POA: Diagnosis not present

## 2022-10-09 DIAGNOSIS — F0283 Dementia in other diseases classified elsewhere, unspecified severity, with mood disturbance: Secondary | ICD-10-CM | POA: Diagnosis not present

## 2022-10-09 DIAGNOSIS — Z8616 Personal history of COVID-19: Secondary | ICD-10-CM | POA: Diagnosis not present

## 2022-10-09 DIAGNOSIS — F02811 Dementia in other diseases classified elsewhere, unspecified severity, with agitation: Secondary | ICD-10-CM | POA: Diagnosis not present

## 2022-10-09 DIAGNOSIS — S12112D Nondisplaced Type II dens fracture, subsequent encounter for fracture with routine healing: Secondary | ICD-10-CM | POA: Diagnosis not present

## 2022-10-09 DIAGNOSIS — G301 Alzheimer's disease with late onset: Secondary | ICD-10-CM | POA: Diagnosis not present

## 2022-10-14 DEATH — deceased

## 2023-03-31 ENCOUNTER — Ambulatory Visit: Payer: Medicare Other | Admitting: Neurology
